# Patient Record
Sex: Male | Born: 1975 | Race: White | Hispanic: No | Smoking: Never smoker
Health system: Southern US, Community
[De-identification: ages and names within clinical notes are randomized; demographics above are authoritative.]

## PROBLEM LIST (undated history)

## (undated) DIAGNOSIS — N200 Calculus of kidney: Secondary | ICD-10-CM

## (undated) DIAGNOSIS — K635 Polyp of colon: Secondary | ICD-10-CM

## (undated) DIAGNOSIS — S83249A Other tear of medial meniscus, current injury, unspecified knee, initial encounter: Secondary | ICD-10-CM

## (undated) DIAGNOSIS — I5189 Other ill-defined heart diseases: Secondary | ICD-10-CM

## (undated) DIAGNOSIS — I2510 Atherosclerotic heart disease of native coronary artery without angina pectoris: Secondary | ICD-10-CM

## (undated) DIAGNOSIS — I10 Essential (primary) hypertension: Secondary | ICD-10-CM

## (undated) DIAGNOSIS — E785 Hyperlipidemia, unspecified: Secondary | ICD-10-CM

## (undated) DIAGNOSIS — R002 Palpitations: Secondary | ICD-10-CM

## (undated) DIAGNOSIS — M51369 Other intervertebral disc degeneration, lumbar region without mention of lumbar back pain or lower extremity pain: Secondary | ICD-10-CM

## (undated) DIAGNOSIS — M5136 Other intervertebral disc degeneration, lumbar region: Secondary | ICD-10-CM

## (undated) DIAGNOSIS — M5126 Other intervertebral disc displacement, lumbar region: Secondary | ICD-10-CM

## (undated) HISTORY — PX: COLONOSCOPY: SHX174

## (undated) HISTORY — PX: MENISCUS REPAIR: SHX5179

## (undated) HISTORY — DX: Atherosclerotic heart disease of native coronary artery without angina pectoris: I25.10

## (undated) HISTORY — DX: Morbid (severe) obesity due to excess calories: E66.01

## (undated) HISTORY — PX: TONSILLECTOMY: SUR1361

## (undated) HISTORY — PX: CARPAL TUNNEL RELEASE: SHX101

## (undated) HISTORY — DX: Other intervertebral disc displacement, lumbar region: M51.26

## (undated) HISTORY — DX: Other tear of medial meniscus, current injury, unspecified knee, initial encounter: S83.249A

## (undated) HISTORY — DX: Hyperlipidemia, unspecified: E78.5

## (undated) HISTORY — PX: LITHOTRIPSY: SUR834

## (undated) HISTORY — DX: Essential (primary) hypertension: I10

## (undated) HISTORY — DX: Other intervertebral disc degeneration, lumbar region: M51.36

## (undated) HISTORY — DX: Other ill-defined heart diseases: I51.89

## (undated) HISTORY — DX: Calculus of kidney: N20.0

## (undated) HISTORY — DX: Other intervertebral disc degeneration, lumbar region without mention of lumbar back pain or lower extremity pain: M51.369

## (undated) HISTORY — DX: Palpitations: R00.2

---
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
## 2006-04-01 ENCOUNTER — Ambulatory Visit: Payer: Self-pay | Admitting: General Practice

## 2008-05-28 ENCOUNTER — Emergency Department: Payer: Self-pay | Admitting: Emergency Medicine

## 2008-06-29 ENCOUNTER — Encounter: Payer: Self-pay | Admitting: Cardiovascular Disease

## 2008-12-27 ENCOUNTER — Encounter: Payer: Self-pay | Admitting: Cardiovascular Disease

## 2009-03-17 ENCOUNTER — Emergency Department: Payer: Self-pay | Admitting: Emergency Medicine

## 2009-03-28 ENCOUNTER — Emergency Department: Payer: Self-pay | Admitting: Emergency Medicine

## 2009-03-30 ENCOUNTER — Ambulatory Visit: Payer: Self-pay | Admitting: Urology

## 2009-03-31 ENCOUNTER — Ambulatory Visit: Payer: Self-pay | Admitting: Urology

## 2009-04-11 ENCOUNTER — Ambulatory Visit: Payer: Self-pay | Admitting: Urology

## 2009-06-29 ENCOUNTER — Encounter: Payer: Self-pay | Admitting: Cardiovascular Disease

## 2009-11-01 ENCOUNTER — Ambulatory Visit: Payer: Self-pay | Admitting: Urology

## 2010-03-02 ENCOUNTER — Emergency Department: Payer: Self-pay | Admitting: Emergency Medicine

## 2010-08-14 ENCOUNTER — Telehealth (INDEPENDENT_AMBULATORY_CARE_PROVIDER_SITE_OTHER): Payer: Self-pay | Admitting: *Deleted

## 2010-08-16 ENCOUNTER — Ambulatory Visit: Payer: Self-pay | Admitting: Cardiovascular Disease

## 2010-08-16 ENCOUNTER — Encounter: Payer: Self-pay | Admitting: Cardiovascular Disease

## 2010-08-16 ENCOUNTER — Ambulatory Visit (INDEPENDENT_AMBULATORY_CARE_PROVIDER_SITE_OTHER): Payer: BC Managed Care – PPO | Admitting: Cardiovascular Disease

## 2010-08-16 DIAGNOSIS — I10 Essential (primary) hypertension: Secondary | ICD-10-CM

## 2010-08-16 DIAGNOSIS — E785 Hyperlipidemia, unspecified: Secondary | ICD-10-CM

## 2010-08-16 DIAGNOSIS — E669 Obesity, unspecified: Secondary | ICD-10-CM

## 2010-08-16 DIAGNOSIS — R000 Tachycardia, unspecified: Secondary | ICD-10-CM

## 2010-08-16 DIAGNOSIS — R0602 Shortness of breath: Secondary | ICD-10-CM | POA: Insufficient documentation

## 2010-08-22 ENCOUNTER — Encounter: Payer: Self-pay | Admitting: Cardiovascular Disease

## 2010-08-24 NOTE — Letter (Signed)
Summary: Medical Record Release  Medical Record Release   Imported By: Harlon Flor 08/18/2010 16:55:07  _____________________________________________________________________  External Attachment:    Type:   Image     Comment:   External Document

## 2010-08-24 NOTE — Progress Notes (Signed)
Summary: Called pt  Phone Note Outgoing Call Call back at [facility] Phone [phone]   Call placed by: Harlon Flor,  August 14, 2010 4:07 PM Call placed to: Patient Summary of Call: LMOM TCB to schedule appt for 08/16/10 to 9:30. Initial call taken by: Harlon Flor,  August 14, 2010 4:07 PM

## 2010-08-24 NOTE — Assessment & Plan Note (Signed)
Summary: [facility]   Visit Type:  Initial Consult Primary Provider:  Dr. Dorothey Baseman  CC:  Establish care. Strong family hx and mother just passed away from MI. c/o rapid HR. denies chest pain and SOB.Marland Kitchen  History of Present Illness: [name] is a very pleasant 35 year old gentleman with obesity, hypertension, hyperlipidemia remote history of tachycardia and palpitations with a strong family history of coronary artery disease who presents to reestablish care.  He reports that his mother died one month ago from heart attack and complications with sepsis.he was told that his mother had a congenital abnormality and that he should have his heart evaluated.  He does have tachycardia palpitations in the evening even on his metoprolol. He is tolerating simvastatin well. No chest pain, he does have some mild shortness of breath with exertion. no significant lower extremity edema. He has been battling with his weight. He does have carpal tunnel as scheduled to have surgery in the near future. He has not had his cholesterol checked this year.  EKG shows normal sinus rhythm with rate 75 beats per minute, no significant ST or T wave changes  Preventive Screening-Counseling & Management  Alcohol-Tobacco     Smoking Status: quit  Caffeine-Diet-Exercise     Does Patient Exercise: no      Drug Use:  no.    Current Medications (verified): 1)  Simvastatin 40 Mg Tabs (Simvastatin) .Marland Kitchen.. 1 Tablet Once Daily 2)  Fish Oil 1000 Mg Caps (Omega-3 Fatty Acids) .Marland Kitchen.. 1 Tablet Once Daily 3)  Metoprolol Tartrate 25 Mg Tabs (Metoprolol Tartrate) .Marland Kitchen.. 1 Tablet Two Times A Day  Allergies (verified): No Known Drug Allergies  Past History:  Past Medical History: Last updated: Sep 14, 2010 CAD Tachy palpitations Hyperlipidemia Hypertension  Family History: Last updated: 2010/09/14 Father: CHF Mother: deceased-MI,CHF Siblings: brother-Heart problems  Social History: Last updated: 09-14-2010 Full  Time Married  Tobacco Use - Former.  Alcohol Use - yes-occasional Regular Exercise - no Drug Use - no  Risk Factors: Exercise: no (09/14/2010)  Risk Factors: Smoking Status: quit (2010-09-14)  Family History: Father: CHF Mother: deceased-MI,CHF Siblings: brother-Heart problems  Social History: Full Time Married  Tobacco Use - Former.  Alcohol Use - yes-occasional Regular Exercise - no Drug Use - no Smoking Status:  quit Does Patient Exercise:  no Drug Use:  no  Review of Systems       The patient complains of weight gain and dyspnea on exertion.  The patient denies fever, weight loss, vision loss, decreased hearing, hoarseness, chest pain, syncope, peripheral edema, prolonged cough, abdominal pain, incontinence, muscle weakness, depression, and enlarged lymph nodes.         tachycardia  Vital Signs:  Patient profile:   35 year old male Height:      70 inches Weight:      331.50 pounds BMI:     47.74 Pulse rate:   75 / minute BP sitting:   129 / 88  (left arm) Cuff size:   large  Vitals Entered By: Lysbeth Galas CMA 2010/09/14 10:16 AM)  Physical Exam  General:  Well developed, well nourished, in no acute distress.obese Head:  normocephalic and atraumatic Neck:  Neck supple, no JVD. No masses, thyromegaly or abnormal cervical nodes. Lungs:  Clear bilaterally to auscultation and percussion. Heart:  Non-displaced PMI, chest non-tender; regular rate and rhythm, S1, S2 without murmurs, rubs or gallops. Carotid upstroke normal, no bruit. Normal abdominal aortic size, no bruits.Pedals normal pulses. No edema, no varicosities.  Abdomen:  Bowel sounds positive; abdomen soft and non-tender without masses, obese Msk:  Back normal, normal gait. Muscle strength and tone normal. Pulses:  pulses normal in all 4 extremities Extremities:  No clubbing or cyanosis. Neurologic:  Alert and oriented x 3. Skin:  Intact without lesions or rashes. Psych:  Normal  affect.   Impression & Recommendations:  Problem # 1:  TACHYCARDIA (ICD-785.0) history of tachycardia palpitations, improved on beta blocker. He continues to have symptoms. Given his mother was diagnosed with recent congenital abnormality ( details are unavailable), we have ordered an echocardiogram. I'm concerned about cardiomyopathy.  Orders: Echocardiogram (Echo)  Problem # 2:  HYPERTENSION, BENIGN (ICD-401.1) Blood pressure was initially elevated on his first check, improved on repeat blood pressure check. I asked him to monitor his blood pressure at home.  His updated medication list for this problem includes:    Metoprolol Tartrate 25 Mg Tabs (Metoprolol tartrate) .Marland Kitchen... 1 tablet two times a day  Problem # 3:  HYPERLIPIDEMIA-MIXED (ICD-272.4) Continue simvastatin 40 mg daily. He will check his cholesterol with Dr. Dorothey Baseman. Ideally, goal LDL should be less than 100.  His updated medication list for this problem includes:    Simvastatin 40 Mg Tabs (Simvastatin) .Marland Kitchen... 1 tablet once daily  Problem # 4:  OVERWEIGHT/OBESITY (ICD-278.02) We have given him a dietary died, encouraged him to increase his exercise and diet  Other Orders: EKG w/ Interpretation (93000)  Patient Instructions: 1)  Your physician recommends that you schedule a follow-up appointment in: 1 year 2)  Your physician recommends that you continue on your current medications as directed. Please refer to the Current Medication list given to you today. 3)  Your physician has requested that you have an echocardiogram.  Echocardiography is a painless test that uses sound waves to create images of your heart. It provides your doctor with information about the size and shape of your heart and how well your heart's chambers and valves are working.  This procedure takes approximately one hour. There are no restrictions for this procedure. Prescriptions: SIMVASTATIN 40 MG TABS (SIMVASTATIN) 1 tablet once daily  #30 x 6    Entered by:   Lanny Hurst RN   Authorized by:   Dossie Arbour MD   Signed by:   Lanny Hurst RN on 08/16/2010   Method used:   Electronically to        [id number] (retail)       [address]       [place], [place]  [address]       Ph: [phone] or [phone]       Fax: [phone]   RxID:   [id number]

## 2010-08-25 ENCOUNTER — Ambulatory Visit: Payer: Self-pay | Admitting: Specialist

## 2010-09-05 NOTE — Letter (Signed)
Summary: Medical Record Release  Medical Record Release   Imported By: Harlon Flor 08/28/2010 16:05:10  _____________________________________________________________________  External Attachment:    Type:   Image     Comment:   External Document

## 2010-09-12 ENCOUNTER — Other Ambulatory Visit (INDEPENDENT_AMBULATORY_CARE_PROVIDER_SITE_OTHER): Payer: BC Managed Care – PPO

## 2010-09-12 ENCOUNTER — Other Ambulatory Visit: Payer: Self-pay | Admitting: Cardiovascular Disease

## 2010-09-12 DIAGNOSIS — R002 Palpitations: Secondary | ICD-10-CM

## 2010-09-19 NOTE — Letter (Signed)
Summary: [facility] Office Note   [facility] Office Note   Imported By: Roderic Ovens 09/15/2010 11:07:44  _____________________________________________________________________  External Attachment:    Type:   Image     Comment:   External Document

## 2011-01-31 ENCOUNTER — Encounter: Payer: Self-pay | Admitting: Cardiovascular Disease

## 2011-04-24 ENCOUNTER — Telehealth: Payer: Self-pay

## 2011-04-24 MED ORDER — SIMVASTATIN 40 MG PO TABS: 40.0000 mg | ORAL_TABLET | Freq: Every day | ORAL | Status: DC

## 2011-04-24 NOTE — Telephone Encounter (Signed)
Refill sent for simvastatin 40 mg take one tablet daily. 

## 2011-05-29 ENCOUNTER — Ambulatory Visit (INDEPENDENT_AMBULATORY_CARE_PROVIDER_SITE_OTHER): Payer: BC Managed Care – PPO | Admitting: Cardiovascular Disease

## 2011-05-29 ENCOUNTER — Encounter: Payer: Self-pay | Admitting: Cardiovascular Disease

## 2011-05-29 VITALS — BP 120/92 | HR 81 | Ht 71.0 in | Wt 333.0 lb

## 2011-05-29 DIAGNOSIS — R0602 Shortness of breath: Secondary | ICD-10-CM

## 2011-05-29 DIAGNOSIS — R000 Tachycardia, unspecified: Secondary | ICD-10-CM

## 2011-05-29 DIAGNOSIS — I10 Essential (primary) hypertension: Secondary | ICD-10-CM

## 2011-05-29 DIAGNOSIS — E663 Overweight: Secondary | ICD-10-CM

## 2011-05-29 DIAGNOSIS — E785 Hyperlipidemia, unspecified: Secondary | ICD-10-CM

## 2011-05-29 NOTE — Assessment & Plan Note (Signed)
We have suggested he have his cholesterol checked at work and fax this to [place]. If cholesterol continues to be greater than 200, we would likely suggest a change to a high dose Lipitor 80 mg daily

## 2011-05-29 NOTE — Assessment & Plan Note (Signed)
Rare episode of shortness of breath that wake him at night time, concerning for sleep apnea. Poor diet, obesity also likely contributing. Unable to exclude mild fluid retention as his diet has been poor. We have offered a low-dose diuretic pill that he could take as needed.

## 2011-05-29 NOTE — Patient Instructions (Addendum)
You are doing well.  Please try omeprazole/prevacid daily for heartburn You can go up to two a day  Call the office if you would like a sleep study  Please call us if you have new issues that need to be addressed before your next appt.  The office will contact you for a follow up Appt. In 6 months

## 2011-05-29 NOTE — Progress Notes (Signed)
Patient ID: Kirk Smith, male    DOB: 30-Jul-1975, 35 y.o.   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 His weight continues to be a problem and he has been working long hours with poor diet and minimal exercise. He reports having 2 episodes that woke him from sleep, both with symptoms of tachycardia, the second with shortness of breath as well. He has been doing better with metoprolol. He does not have any episodes of tachycardia in the daytime. There is significant stress in his life. He is uncertain if the events overnight with tachycardia were from panic attack. The second episode he felt that he might have had water in his lungs. The first episode felt like he had been sprinting and lasted for quite some time before it resolved. He reported heart rates in the 90-100 range.   EKG shows normal sinus rhythm with rate 84 beats per minute, no significant ST or T wave changes     Outpatient Encounter Prescriptions as of 05/29/2011  Medication Sig Dispense Refill  . fenofibrate (TRICOR) 145 MG tablet Take 145 mg by mouth daily.        . metoprolol tartrate (LOPRESSOR) 25 MG tablet Take 25 mg by mouth 2 (two) times daily.        . Omega-3 Fatty Acids (FISH OIL) 1000 MG CAPS Take 1 capsule by mouth daily.        . simvastatin (ZOCOR) 40 MG tablet Take 1 tablet (40 mg total) by mouth daily.  30 tablet  6    Review of Systems  Constitutional: Negative.   HENT: Negative.   Eyes: Negative.   Respiratory: Positive for shortness of breath.   Cardiovascular: Positive for palpitations.  Gastrointestinal: Negative.   Musculoskeletal: Negative.   Skin: Negative.   Neurological: Negative.   Hematological:  Negative.   Psychiatric/Behavioral: Negative.   All other systems reviewed and are negative.    BP 120/92  Pulse 81  Ht 5\' 11"  (1.803 m)  Wt 333 lb (151.048 kg)  BMI 46.44 kg/m2  Physical Exam  Nursing note and vitals reviewed. Constitutional: He is oriented to person, place, and time. He appears well-developed and well-nourished.       obese  HENT:  Head: Normocephalic.  Nose: Nose normal.  Mouth/Throat: Oropharynx is clear and moist.  Eyes: Conjunctivae are normal. Pupils are equal, round, and reactive to light.  Neck: Normal range of motion. Neck supple. No JVD present.  Cardiovascular: Normal rate, regular rhythm, S1 normal, S2 normal, normal heart sounds and intact distal pulses.  Exam reveals no gallop and no friction rub.   No murmur heard. Pulmonary/Chest: Effort normal and breath sounds normal. No respiratory distress. He has no wheezes. He has no rales. He exhibits no tenderness.  Abdominal: Soft. Bowel sounds are normal. He exhibits no distension. There is no tenderness.  Musculoskeletal: Normal range of motion. He exhibits no edema and no tenderness.  Lymphadenopathy:    He has no cervical adenopathy.  Neurological: He is alert and oriented to person, place, and time. Coordination normal.  Skin: Skin is warm and dry. No rash noted. No erythema.  Psychiatric: He has a normal mood and affect. His behavior is normal. Judgment and thought content normal.  Assessment and Plan

## 2011-05-29 NOTE — Assessment & Plan Note (Signed)
Recent episodes of tachycardia that wake him at night. We discussed the various options and differential. Uncertain if this is secondary to stress. Unable to exclude obstructive sleep apnea. He will discuss his snoring with his wife. He does not think that he snores very much. If you would like a stress test, I have asked him to call our office and we will help arrange this.

## 2011-05-29 NOTE — Assessment & Plan Note (Signed)
Blood pressure is well controlled on today's visit. No changes made to the medications. 

## 2011-05-29 NOTE — Assessment & Plan Note (Signed)
His weight is a major issue. In the future, we might suggest a lap band or gastric bypass surgery.

## 2011-06-19 ENCOUNTER — Telehealth: Payer: Self-pay | Admitting: *Deleted

## 2011-06-19 DIAGNOSIS — E785 Hyperlipidemia, unspecified: Secondary | ICD-10-CM

## 2011-06-19 MED ORDER — ATORVASTATIN CALCIUM 80 MG PO TABS: 40.0000 mg | ORAL_TABLET | Freq: Every day | ORAL | Status: DC

## 2011-06-19 NOTE — Telephone Encounter (Signed)
Opened in error

## 2011-06-19 NOTE — Telephone Encounter (Signed)
Copied from lab note: Pt had labs at work Systems analyst) you saw on 11/20 said if cholesterol continues to be greater than 200, we would likely suggest a change to a high dose Lipitor 80 mg daily; he currently takes zocor 40mg  daily. Total is 167.   Pt called back this AM stating his PCP wanted him to change to crestor. Discussed with Dr. Mariah Milling, and advised he start Lipitor 80mg  first. Pt asks if he can start with 40mg  first then go to 80 if needed. Rx sent for 80mg  pt will cut in half and will need labs in 3 mo.

## 2011-07-18 ENCOUNTER — Telehealth: Payer: Self-pay

## 2011-07-18 MED ORDER — METOPROLOL TARTRATE 25 MG PO TABS: 25.0000 mg | ORAL_TABLET | Freq: Two times a day (BID) | ORAL | Status: DC

## 2011-07-18 NOTE — Telephone Encounter (Signed)
Refill sent for metoprolol.  

## 2011-10-18 ENCOUNTER — Telehealth: Payer: Self-pay

## 2011-10-18 MED ORDER — METOPROLOL TARTRATE 25 MG PO TABS: 25.0000 mg | ORAL_TABLET | Freq: Two times a day (BID) | ORAL | Status: DC

## 2011-10-18 NOTE — Telephone Encounter (Signed)
Refill sent for 90 day supply for metoprolol.

## 2012-08-01 ENCOUNTER — Ambulatory Visit (INDEPENDENT_AMBULATORY_CARE_PROVIDER_SITE_OTHER): Payer: BC Managed Care – PPO | Admitting: Cardiovascular Disease

## 2012-08-01 ENCOUNTER — Encounter: Payer: Self-pay | Admitting: Cardiovascular Disease

## 2012-08-01 VITALS — BP 148/80 | HR 81 | Ht 70.0 in | Wt 327.8 lb

## 2012-08-01 DIAGNOSIS — E785 Hyperlipidemia, unspecified: Secondary | ICD-10-CM

## 2012-08-01 DIAGNOSIS — E663 Overweight: Secondary | ICD-10-CM

## 2012-08-01 DIAGNOSIS — I10 Essential (primary) hypertension: Secondary | ICD-10-CM

## 2012-08-01 DIAGNOSIS — R000 Tachycardia, unspecified: Secondary | ICD-10-CM

## 2012-08-01 NOTE — Patient Instructions (Addendum)
You are doing well. Please try extra 1/2 doses of metoprolol for high blood pressure  Blood work through city  Please call us if you have new issues that need to be addressed before your next appt.  Your physician wants you to follow-up in: 12 months.  You will receive a reminder letter in the mail two months in advance. If you don't receive a letter, please call our office to schedule the follow-up appointment.

## 2012-08-01 NOTE — Assessment & Plan Note (Signed)
We have encouraged continued exercise, careful diet management in an effort to lose weight. 

## 2012-08-01 NOTE — Assessment & Plan Note (Signed)
Blood pressure running borderline high. We have suggested he increase metoprolol tartrate to 37.5 mg twice a day. If blood pressure continues to run high, additional medications could be added such as HCTZ or amlodipine.

## 2012-08-01 NOTE — Progress Notes (Signed)
   Patient ID: Kirk Smith, male    DOB: [birth year], 37 y.o.   MRN: [id number]  HPI Comments: Mr. 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 His weight continues to be a problem and he has been working long hours with poor diet and minimal exercise. No recent symptoms of tachycardia and he is taking metoprolol tartrate 25 mg twice a day Significant recent stress, loss of his brother and his wife in motor vehicle accident. they were on a motorcycle.  Tolerating statin with no problems. No recent blood work.   EKG shows normal sinus rhythm with rate 81 beats per minute, no significant ST or T wave changes     Outpatient Encounter Prescriptions as of 08/01/2012  Medication Sig Dispense Refill  . atorvastatin (LIPITOR) 40 MG tablet Take 40 mg by mouth daily.      . fenofibrate (TRICOR) 145 MG tablet Take 145 mg by mouth daily.        . metoprolol tartrate (LOPRESSOR) 25 MG tablet Take 1 tablet (25 mg total) by mouth 2 (two) times daily.  180 tablet  3  . [DISCONTINUED] Omega-3 Fatty Acids (FISH OIL) 1000 MG CAPS Take 1 capsule by mouth daily.          Review of Systems  Constitutional: Negative.   HENT: Negative.   Eyes: Negative.   Gastrointestinal: Negative.   Musculoskeletal: Negative.   Skin: Negative.   Neurological: Negative.   Hematological: Negative.   Psychiatric/Behavioral: Negative.   All other systems reviewed and are negative.    BP 148/80  Pulse 81  Ht 5\' 10"  (1.778 m)  Wt 327 lb 12 oz (148.666 kg)  BMI 47.03 kg/m2  Physical Exam  Nursing note and vitals reviewed. Constitutional: He is oriented to person, place, and time. He appears well-developed and well-nourished.       obese  HENT:  Head:  Normocephalic.  Nose: Nose normal.  Mouth/Throat: Oropharynx is clear and moist.  Eyes: Conjunctivae normal are normal. Pupils are equal, round, and reactive to light.  Neck: Normal range of motion. Neck supple. No JVD present.  Cardiovascular: Normal rate, regular rhythm, S1 normal, S2 normal, normal heart sounds and intact distal pulses.  Exam reveals no gallop and no friction rub.   No murmur heard. Pulmonary/Chest: Effort normal and breath sounds normal. No respiratory distress. He has no wheezes. He has no rales. He exhibits no tenderness.  Abdominal: Soft. Bowel sounds are normal. He exhibits no distension. There is no tenderness.  Musculoskeletal: Normal range of motion. He exhibits no edema and no tenderness.  Lymphadenopathy:    He has no cervical adenopathy.  Neurological: He is alert and oriented to person, place, and time. Coordination normal.  Skin: Skin is warm and dry. No rash noted. No erythema.  Psychiatric: He has a normal mood and affect. His behavior is normal. Judgment and thought content normal.           Assessment and Plan

## 2012-08-01 NOTE — Assessment & Plan Note (Addendum)
We have suggested he check his blood work. This will be done through the city.

## 2012-08-01 NOTE — Assessment & Plan Note (Signed)
No recent episodes of tachycardia. Continue metoprolol.

## 2012-08-05 ENCOUNTER — Other Ambulatory Visit: Payer: Self-pay | Admitting: Cardiovascular Disease

## 2012-09-09 ENCOUNTER — Other Ambulatory Visit: Payer: Self-pay | Admitting: Cardiovascular Disease

## 2012-09-09 NOTE — Telephone Encounter (Signed)
Refilled Metoprolol sent to [facility]. 

## 2013-03-11 ENCOUNTER — Ambulatory Visit: Payer: Self-pay | Admitting: General Practice

## 2013-05-25 ENCOUNTER — Ambulatory Visit: Payer: Self-pay | Admitting: General Practice

## 2013-07-28 ENCOUNTER — Ambulatory Visit: Payer: Self-pay | Admitting: Specialist

## 2013-08-03 ENCOUNTER — Encounter (INDEPENDENT_AMBULATORY_CARE_PROVIDER_SITE_OTHER): Payer: Self-pay

## 2013-08-03 ENCOUNTER — Encounter: Payer: Self-pay | Admitting: Cardiovascular Disease

## 2013-08-03 ENCOUNTER — Ambulatory Visit (INDEPENDENT_AMBULATORY_CARE_PROVIDER_SITE_OTHER): Payer: BC Managed Care – PPO | Admitting: Cardiovascular Disease

## 2013-08-03 VITALS — BP 141/86 | HR 80 | Ht 71.0 in | Wt 313.2 lb

## 2013-08-03 DIAGNOSIS — E663 Overweight: Secondary | ICD-10-CM

## 2013-08-03 DIAGNOSIS — E785 Hyperlipidemia, unspecified: Secondary | ICD-10-CM

## 2013-08-03 DIAGNOSIS — R000 Tachycardia, unspecified: Secondary | ICD-10-CM

## 2013-08-03 DIAGNOSIS — I10 Essential (primary) hypertension: Secondary | ICD-10-CM

## 2013-08-03 MED ORDER — METOPROLOL TARTRATE 25 MG PO TABS
25.0000 mg | ORAL_TABLET | Freq: Two times a day (BID) | ORAL | Status: DC
Start: 2013-08-03 — End: 2014-09-16

## 2013-08-03 MED ORDER — ATORVASTATIN CALCIUM 40 MG PO TABS: 40.0000 mg | ORAL_TABLET | Freq: Every day | ORAL | Status: DC

## 2013-08-03 NOTE — Assessment & Plan Note (Signed)
We have encouraged continued exercise, careful diet management in an effort to lose weight. 

## 2013-08-03 NOTE — Assessment & Plan Note (Signed)
Re: well-controlled on low-dose metoprolol

## 2013-08-03 NOTE — Progress Notes (Signed)
   Patient ID: Kirk LindauDavid Wayne Smith, male    DOB: 10/09/1975, 38 y.o.   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  On his visit today, he has been exercising on a regular basis, down 15 pounds from his prior clinic visit. Overall feels well with no complaints. No recent symptoms of tachycardia and he is taking metoprolol tartrate 25 mg twice a day Tolerating statin with no problems. Last blood work January 2014 with total cholesterol less than 150 He wonders if he can stop the fenofibrate   EKG shows normal sinus rhythm with rate 80 beats per minute, no significant ST or T wave changes     Outpatient Encounter Prescriptions as of 08/03/2013  Medication Sig  . atorvastatin (LIPITOR) 40 MG tablet Take 40 mg by mouth daily.  . fenofibrate (TRICOR) 145 MG tablet Take 145 mg by mouth daily.    . metoprolol tartrate (LOPRESSOR) 25 MG tablet TAKE 1 TABLET BY MOUTH TWICE A DAY  . [DISCONTINUED] atorvastatin (LIPITOR) 40 MG tablet Take 40 mg by mouth daily.  . [DISCONTINUED] atorvastatin (LIPITOR) 80 MG tablet TAKE 1/2 TABLET BY MOUTH EVERY DAY    Review of Systems  Constitutional: Negative.   HENT: Negative.   Eyes: Negative.   Respiratory: Negative.   Cardiovascular: Negative.   Gastrointestinal: Negative.   Endocrine: Negative.   Musculoskeletal: Negative.   Skin: Negative.   Allergic/Immunologic: Negative.   Neurological: Negative.   Hematological: Negative.   Psychiatric/Behavioral: Negative.   All other systems reviewed and are negative.    BP 141/86  Pulse 80  Ht 5\' 11"  (1.803 m)  Wt 313 lb 4 oz (142.089 kg)  BMI 43.71 kg/m2  Physical Exam  Nursing note and vitals  reviewed. Constitutional: He is oriented to person, place, and time. He appears well-developed and well-nourished.  obese  HENT:  Head: Normocephalic.  Nose: Nose normal.  Mouth/Throat: Oropharynx is clear and moist.  Eyes: Conjunctivae are normal. Pupils are equal, round, and reactive to light.  Neck: Normal range of motion. Neck supple. No JVD present.  Cardiovascular: Normal rate, regular rhythm, S1 normal, S2 normal, normal heart sounds and intact distal pulses.  Exam reveals no gallop and no friction rub.   No murmur heard. Pulmonary/Chest: Effort normal and breath sounds normal. No respiratory distress. He has no wheezes. He has no rales. He exhibits no tenderness.  Abdominal: Soft. Bowel sounds are normal. He exhibits no distension. There is no tenderness.  Musculoskeletal: Normal range of motion. He exhibits no edema and no tenderness.  Lymphadenopathy:    He has no cervical adenopathy.  Neurological: He is alert and oriented to person, place, and time. Coordination normal.  Skin: Skin is warm and dry. No rash noted. No erythema.  Psychiatric: He has a normal mood and affect. His behavior is normal. Judgment and thought content normal.      Assessment and Plan

## 2013-08-03 NOTE — Assessment & Plan Note (Signed)
Cholesterol is at goal on the current lipid regimen. No changes to the medications were made. Okay to hold the fenofibrate

## 2013-08-03 NOTE — Assessment & Plan Note (Signed)
Blood pressure is well controlled on today's visit. No changes made to the medications. He does have occasional fatigue. Suggested he could try Toprol 12.5 mg twice a day

## 2013-08-03 NOTE — Patient Instructions (Signed)
You are doing well. No medication changes were made.  Ok to cut the metoprolol in 1/2 twice a day If triglycerides stay  low, you could stop the fenofibrate  Please call us if you have new issues that need to be addressed before your next appt.  Your physician wants you to follow-up in: 12 months.  You will receive a reminder letter in the mail two months in advance. If you don't receive a letter, please call our office to schedule the follow-up appointment.

## 2013-11-24 ENCOUNTER — Encounter: Payer: Self-pay | Admitting: General Practice

## 2013-12-07 ENCOUNTER — Encounter: Payer: Self-pay | Admitting: General Practice

## 2014-01-06 ENCOUNTER — Encounter: Payer: Self-pay | Admitting: General Practice

## 2014-02-03 ENCOUNTER — Ambulatory Visit: Payer: Self-pay | Admitting: General Practice

## 2014-03-23 DIAGNOSIS — M5136 Other intervertebral disc degeneration, lumbar region: Secondary | ICD-10-CM | POA: Insufficient documentation

## 2014-03-23 DIAGNOSIS — M51369 Other intervertebral disc degeneration, lumbar region without mention of lumbar back pain or lower extremity pain: Secondary | ICD-10-CM | POA: Insufficient documentation

## 2014-03-23 DIAGNOSIS — M62830 Muscle spasm of back: Secondary | ICD-10-CM | POA: Insufficient documentation

## 2014-08-12 ENCOUNTER — Emergency Department: Payer: Self-pay | Admitting: Emergency Medicine

## 2014-08-12 LAB — URINALYSIS, COMPLETE
BACTERIA: NONE SEEN
BILIRUBIN, UR: NEGATIVE
GLUCOSE, UR: NEGATIVE mg/dL (ref 0–75)
KETONE: NEGATIVE
Leukocyte Esterase: NEGATIVE
NITRITE: NEGATIVE
Ph: 5 (ref 4.5–8.0)
Protein: 30
RBC,UR: 52 /HPF (ref 0–5)
SPECIFIC GRAVITY: 1.031 (ref 1.003–1.030)

## 2014-08-12 LAB — COMPREHENSIVE METABOLIC PANEL
ALT: 47 U/L (ref 14–63)
ANION GAP: 6 — AB (ref 7–16)
AST: 26 U/L (ref 15–37)
Albumin: 3.7 g/dL (ref 3.4–5.0)
Alkaline Phosphatase: 89 U/L (ref 46–116)
BUN: 11 mg/dL (ref 7–18)
Bilirubin,Total: 0.4 mg/dL (ref 0.2–1.0)
CHLORIDE: 106 mmol/L (ref 98–107)
CO2: 29 mmol/L (ref 21–32)
CREATININE: 1.19 mg/dL (ref 0.60–1.30)
Calcium, Total: 9 mg/dL (ref 8.5–10.1)
EGFR (African American): >60
Glucose: 101 mg/dL — ABNORMAL HIGH (ref 65–99)
Osmolality: 281 (ref 275–301)
POTASSIUM: 4.4 mmol/L (ref 3.5–5.1)
Sodium: 141 mmol/L (ref 136–145)
Total Protein: 7.5 g/dL (ref 6.4–8.2)

## 2014-08-12 LAB — CBC
HCT: 44.4 % (ref 40.0–52.0)
HGB: 14.7 g/dL (ref 13.0–18.0)
MCH: 29.4 pg (ref 26.0–34.0)
MCHC: 33.2 g/dL (ref 32.0–36.0)
MCV: 89 fL (ref 80–100)
Platelet: 243 10*3/uL (ref 150–440)
RBC: 5.01 10*6/uL (ref 4.40–5.90)
RDW: 12.8 % (ref 11.5–14.5)
WBC: 7.8 10*3/uL (ref 3.8–10.6)

## 2014-08-16 ENCOUNTER — Emergency Department: Payer: Self-pay | Admitting: Emergency Medicine

## 2014-08-19 ENCOUNTER — Ambulatory Visit: Payer: Self-pay | Admitting: Urology

## 2014-08-27 ENCOUNTER — Ambulatory Visit: Payer: Self-pay | Admitting: Urology

## 2014-09-02 ENCOUNTER — Other Ambulatory Visit: Payer: Self-pay | Admitting: Cardiovascular Disease

## 2014-09-04 ENCOUNTER — Other Ambulatory Visit: Payer: Self-pay | Admitting: Cardiovascular Disease

## 2014-09-11 ENCOUNTER — Ambulatory Visit: Payer: Self-pay | Admitting: General Practice

## 2014-09-16 ENCOUNTER — Other Ambulatory Visit: Payer: Self-pay

## 2014-09-16 MED ORDER — METOPROLOL TARTRATE 25 MG PO TABS: 25.0000 mg | ORAL_TABLET | Freq: Two times a day (BID) | ORAL | Status: DC

## 2014-09-23 ENCOUNTER — Encounter (INDEPENDENT_AMBULATORY_CARE_PROVIDER_SITE_OTHER): Payer: Self-pay

## 2014-09-23 ENCOUNTER — Ambulatory Visit (INDEPENDENT_AMBULATORY_CARE_PROVIDER_SITE_OTHER): Payer: BLUE CROSS/BLUE SHIELD | Admitting: Cardiovascular Disease

## 2014-09-23 ENCOUNTER — Encounter: Payer: Self-pay | Admitting: Cardiovascular Disease

## 2014-09-23 VITALS — BP 130/86 | HR 72 | Ht 71.0 in | Wt 312.0 lb

## 2014-09-23 DIAGNOSIS — M75101 Unspecified rotator cuff tear or rupture of right shoulder, not specified as traumatic: Secondary | ICD-10-CM

## 2014-09-23 DIAGNOSIS — R000 Tachycardia, unspecified: Secondary | ICD-10-CM

## 2014-09-23 DIAGNOSIS — I10 Essential (primary) hypertension: Secondary | ICD-10-CM

## 2014-09-23 DIAGNOSIS — E785 Hyperlipidemia, unspecified: Secondary | ICD-10-CM

## 2014-09-23 DIAGNOSIS — M75100 Unspecified rotator cuff tear or rupture of unspecified shoulder, not specified as traumatic: Secondary | ICD-10-CM | POA: Insufficient documentation

## 2014-09-23 DIAGNOSIS — E669 Obesity, unspecified: Secondary | ICD-10-CM

## 2014-09-23 MED ORDER — ATORVASTATIN CALCIUM 40 MG PO TABS: 40.0000 mg | ORAL_TABLET | Freq: Every day | ORAL | Status: DC

## 2014-09-23 MED ORDER — METOPROLOL TARTRATE 25 MG PO TABS: 25.0000 mg | ORAL_TABLET | Freq: Two times a day (BID) | ORAL | Status: DC

## 2014-09-23 NOTE — Progress Notes (Signed)
Patient ID: Kirk Smith, male    DOB: 01/14/1976, 39 y.o.   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 He had a injury to his back and bulging disc, kidney stone requiring cystoscopy and intervention, tear in his right shoulder to the rotator cuff. Unclear if he needs surgery at this time. He has follow-up with with PDA set [facility] week. Otherwise feels well. Very rare palpitations, in general has done well on metoprolol. Tolerating his cholesterol medication. He remembers total cholesterol roughly 140 on his current regimen. Denies any shortness of breath or chest discomfort. Otherwise exercising on a regular basis to stay in shape for his job. Very active at baseline.  EKG on today's visit  shows normal sinus rhythm with rate 72 bpm, no significant ST or T-wave changes   Other past medical history  Previous echocardiogram was essentially normal.   Allergies  Allergen Reactions  . Penicillin G Other (See Comments)  . Penicillins     Outpatient Encounter Prescriptions as of 09/23/2014  Medication Sig  . atorvastatin (LIPITOR) 40 MG tablet Take 1 tablet (40 mg total) by mouth daily.  . meloxicam (MOBIC) 15 MG tablet Take 15 mg by mouth daily.   . methocarbamol (ROBAXIN) 750 MG tablet 1/2-1 po tid prn  . metoprolol tartrate (LOPRESSOR) 25 MG tablet Take 1 tablet (25 mg total) by mouth 2 (two) times daily.  . Omega-3 Fatty Acids (FISH OIL) 1000 MG CAPS Take by mouth.  . [DISCONTINUED] atorvastatin (LIPITOR) 40 MG tablet TAKE 1 TABLET BY MOUTH EVERY DAY  . [DISCONTINUED] metoprolol tartrate (LOPRESSOR) 25 MG tablet Take 1 tablet (25 mg total) by mouth 2 (two) times daily.   . [DISCONTINUED] fenofibrate (TRICOR) 145 MG tablet Take 145 mg by mouth daily.      Past Medical History  Diagnosis Date  . CAD (coronary artery disease)   . Tachycardia     palpitations  . HLD (hyperlipidemia)   . HTN (hypertension)   . Kidney stone   . Bulging lumbar disc     Past Surgical History  Procedure Laterality Date  . Carpal tunnel release    . Carpal tunnel release Left     Social History  reports that he has quit smoking. He does not have any smokeless tobacco history on file. He reports that he does not drink alcohol or use illicit drugs.  Family History family history includes Heart disease in his brother; Heart failure in his father.  Review of Systems  Constitutional: Negative.   Respiratory: Negative.   Cardiovascular: Negative.   Gastrointestinal: Negative.   Musculoskeletal: Negative.   Skin: Negative.   Neurological: Negative.   Hematological: Negative.   Psychiatric/Behavioral: Negative.   All other systems reviewed and are negative.   BP 130/86 mmHg  Pulse 72  Ht 5\' 11"  (1.803 m)  Wt 312 lb (141.522 kg)  BMI 43.53 kg/m2  Physical Exam  Constitutional: He is oriented to person, place, and time. He appears well-developed and well-nourished.  obese  HENT:  Head: Normocephalic.  Nose: Nose normal.  Mouth/Throat: Oropharynx is clear and moist.  Eyes: Conjunctivae are normal. Pupils are equal, round, and reactive to light.  Neck: Normal range of  motion. Neck supple. No JVD present.  Cardiovascular: Normal rate, regular rhythm, S1 normal, S2 normal, normal heart sounds and intact distal pulses.  Exam reveals no gallop and no friction rub.   No murmur heard. Pulmonary/Chest: Effort normal and breath sounds normal. No respiratory distress. He has no wheezes. He has no rales. He exhibits no tenderness.  Abdominal: Soft. Bowel sounds are normal. He exhibits no distension. There is no tenderness.  Musculoskeletal: Normal range of motion. He  exhibits no edema or tenderness.  Lymphadenopathy:    He has no cervical adenopathy.  Neurological: He is alert and oriented to person, place, and time. Coordination normal.  Skin: Skin is warm and dry. No rash noted. No erythema.  Psychiatric: He has a normal mood and affect. His behavior is normal. Judgment and thought content normal.      Assessment and Plan   Nursing note and vitals reviewed.

## 2014-09-23 NOTE — Assessment & Plan Note (Signed)
We have encouraged continued exercise, careful diet management in an effort to lose weight.He

## 2014-09-23 NOTE — Assessment & Plan Note (Signed)
Blood pressure is well controlled on today's visit. No changes made to the medications. 

## 2014-09-23 NOTE — Assessment & Plan Note (Signed)
[facility]. He would be acceptable risk if surgery is needed. No further testing needed. Recommend keeping him on metoprolol through the perioperative period

## 2014-09-23 NOTE — Patient Instructions (Signed)
You are doing well. No medication changes were made.  Please call us if you have new issues that need to be addressed before your next appt.  Your physician wants you to follow-up in: 12 months.  You will receive a reminder letter in the mail two months in advance. If you don't receive a letter, please call our office to schedule the follow-up appointment. 

## 2014-09-23 NOTE — Assessment & Plan Note (Signed)
well-controlled on low-dose metoprolol

## 2014-09-23 NOTE — Assessment & Plan Note (Signed)
Cholesterol is at goal on the current lipid regimen. No changes to the medications were made.  

## 2014-10-30 NOTE — Op Note (Signed)
PATIENT NAME:  Kirk Smith, Kirk Smith MR#:  [id number] DATE OF BIRTH:  1975-10-05  DATE OF PROCEDURE:  07/28/2013  PREOPERATIVE DIAGNOSIS:  Left carpal tunnel syndrome.  POSTOPERATIVE DIAGNOSIS: Left carpal tunnel syndrome.   PROCEDURE PERFORMED: Left carpal tunnel release.   SURGEON: Myra Rudehristopher Jalal Rauch, M.D.   ANESTHESIA: General.   COMPLICATIONS: None.   TOURNIQUET TIME: Approximately 15 minutes.   PROCEDURE IN DETAIL:  After adequate induction of general anesthesia, the left upper extremity is thoroughly prepped with alcohol and ChloraPrep and draped in standard sterile fashion. The extremity is wrapped out with the Esmarch bandage and pneumatic tourniquet elevated to 280 mmHg. Under loupe magnification, standard volar carpal tunnel incision is made, and the dissection carried down to the transverse retinacular ligament. This is incised in the midportion using the knife. The proximal release is performed with the small scissors and the carpal tunnel scissors, and the distal release is performed using the small scissors. There is seen to be moderate compression of the nerve directly beneath the ligament. Careful check is made both proximally and distally to ensure that complete release had been obtained. The wound is thoroughly irrigated multiple times. Skin edges are infiltrated with 0.5% plain Marcaine. The skin is closed with 4-0 nylon suture. Soft bulky dressing is applied. The tourniquet is released. The patient is returned to the recovery room in satisfactory condition having tolerated the procedure quite well.     ____________________________ Clare Gandyhristopher E. Zyeir Dymek, MD ces:dmm D: 07/28/2013 10:23:04 ET T: 07/28/2013 12:34:27 ET JOB#: [id number]  cc: Clare Gandyhristopher E. Darlisha Kelm, MD, <Dictator> Clare GandyHRISTOPHER E Amariyon Maynes MD ELECTRONICALLY SIGNED 07/30/2013 20:23

## 2014-12-06 ENCOUNTER — Emergency Department: Payer: BLUE CROSS/BLUE SHIELD

## 2014-12-06 ENCOUNTER — Emergency Department
Admission: EM | Admit: 2014-12-06 | Discharge: 2014-12-06 | Disposition: A | Discharge status: Home / Self Care | Payer: BLUE CROSS/BLUE SHIELD | Attending: Emergency Medicine | Admitting: Emergency Medicine

## 2014-12-06 ENCOUNTER — Encounter: Payer: Self-pay | Admitting: Emergency Medicine

## 2014-12-06 DIAGNOSIS — Z880 Allergy status to penicillin: Secondary | ICD-10-CM | POA: Insufficient documentation

## 2014-12-06 DIAGNOSIS — R109 Unspecified abdominal pain: Secondary | ICD-10-CM

## 2014-12-06 DIAGNOSIS — I10 Essential (primary) hypertension: Secondary | ICD-10-CM | POA: Insufficient documentation

## 2014-12-06 DIAGNOSIS — Z79899 Other long term (current) drug therapy: Secondary | ICD-10-CM | POA: Insufficient documentation

## 2014-12-06 DIAGNOSIS — R319 Hematuria, unspecified: Secondary | ICD-10-CM

## 2014-12-06 DIAGNOSIS — Z87891 Personal history of nicotine dependence: Secondary | ICD-10-CM | POA: Diagnosis not present

## 2014-12-06 DIAGNOSIS — Z791 Long term (current) use of non-steroidal anti-inflammatories (NSAID): Secondary | ICD-10-CM | POA: Diagnosis not present

## 2014-12-06 DIAGNOSIS — N201 Calculus of ureter: Secondary | ICD-10-CM | POA: Diagnosis not present

## 2014-12-06 LAB — BASIC METABOLIC PANEL
ANION GAP: 8 (ref 5–15)
BUN: 14 mg/dL (ref 6–20)
CO2: 27 mmol/L (ref 22–32)
CREATININE: 0.97 mg/dL (ref 0.61–1.24)
Calcium: 9 mg/dL (ref 8.9–10.3)
Chloride: 106 mmol/L (ref 101–111)
GFR calc non Af Amer: >60 mL/min (ref >60)
Glucose, Bld: 114 mg/dL — ABNORMAL HIGH (ref 65–99)
POTASSIUM: 4.6 mmol/L (ref 3.5–5.1)
Sodium: 141 mmol/L (ref 135–145)

## 2014-12-06 LAB — URINALYSIS COMPLETE WITH MICROSCOPIC (ARMC ONLY)
BACTERIA UA: NONE SEEN
BILIRUBIN URINE: NEGATIVE
Glucose, UA: NEGATIVE mg/dL
Ketones, ur: NEGATIVE mg/dL
Leukocytes, UA: NEGATIVE
Nitrite: NEGATIVE
Protein, ur: >500 mg/dL — AB
Specific Gravity, Urine: 1.028 (ref 1.005–1.030)
Squamous Epithelial / LPF: NONE SEEN
pH: 5 (ref 5.0–8.0)

## 2014-12-06 LAB — CBC
HCT: 45.1 % (ref 40.0–52.0)
HEMOGLOBIN: 15.2 g/dL (ref 13.0–18.0)
MCH: 30.1 pg (ref 26.0–34.0)
MCHC: 33.7 g/dL (ref 32.0–36.0)
MCV: 89.4 fL (ref 80.0–100.0)
Platelets: 233 10*3/uL (ref 150–440)
RBC: 5.04 MIL/uL (ref 4.40–5.90)
RDW: 12.8 % (ref 11.5–14.5)
WBC: 10.5 10*3/uL (ref 3.8–10.6)

## 2014-12-06 MED ORDER — HYDROMORPHONE HCL 1 MG/ML IJ SOLN
1.0000 mg | INTRAMUSCULAR | Status: DC | PRN
  Administered 2014-12-06: 1 mg via INTRAVENOUS

## 2014-12-06 MED ORDER — ONDANSETRON HCL 4 MG/2ML IJ SOLN
4.0000 mg | Freq: Once | INTRAMUSCULAR | Status: AC
  Administered 2014-12-06: 4 mg via INTRAVENOUS

## 2014-12-06 MED ORDER — ONDANSETRON HCL 4 MG/2ML IJ SOLN
INTRAMUSCULAR | Status: AC
  Administered 2014-12-06: 4 mg via INTRAVENOUS
  Filled 2014-12-06: qty 2

## 2014-12-06 MED ORDER — HYDROMORPHONE HCL 1 MG/ML IJ SOLN
INTRAMUSCULAR | Status: AC
  Filled 2014-12-06: qty 1

## 2014-12-06 MED ORDER — HYDROMORPHONE HCL 1 MG/ML IJ SOLN
1.0000 mg | Freq: Once | INTRAMUSCULAR | Status: AC
  Administered 2014-12-06: 1 mg via INTRAVENOUS

## 2014-12-06 MED ORDER — MORPHINE SULFATE 4 MG/ML IJ SOLN
INTRAMUSCULAR | Status: AC
  Filled 2014-12-06: qty 1

## 2014-12-06 MED ORDER — ONDANSETRON HCL 4 MG PO TABS: 4.0000 mg | ORAL_TABLET | Freq: Every day | ORAL | Status: DC | PRN

## 2014-12-06 MED ORDER — TAMSULOSIN HCL 0.4 MG PO CAPS: 0.4000 mg | ORAL_CAPSULE | Freq: Every day | ORAL | Status: DC

## 2014-12-06 MED ORDER — PROMETHAZINE HCL 12.5 MG PO TABS: 12.5000 mg | ORAL_TABLET | Freq: Four times a day (QID) | ORAL | Status: DC | PRN

## 2014-12-06 MED ORDER — MORPHINE SULFATE 4 MG/ML IJ SOLN
INTRAMUSCULAR | Status: AC
  Administered 2014-12-06: 4 mg via INTRAVENOUS
  Filled 2014-12-06: qty 1

## 2014-12-06 MED ORDER — MORPHINE SULFATE 4 MG/ML IJ SOLN
4.0000 mg | Freq: Once | INTRAMUSCULAR | Status: AC
  Administered 2014-12-06: 4 mg via INTRAVENOUS

## 2014-12-06 MED ORDER — OXYCODONE-ACETAMINOPHEN 5-325 MG PO TABS: 1.0000 | ORAL_TABLET | ORAL | Status: DC | PRN

## 2014-12-06 MED ORDER — ONDANSETRON HCL 4 MG/2ML IJ SOLN
INTRAMUSCULAR | Status: AC
  Filled 2014-12-06: qty 2

## 2014-12-06 NOTE — ED Notes (Signed)
Patient requesting more pain medication. Dr. Derrill KayGoodman notified.

## 2014-12-06 NOTE — Discharge Instructions (Signed)
Please seek medical attention for any high fevers, chest pain, shortness of breath, change in behavior, persistent vomiting, bloody stool or any other new or concerning symptoms. ° ° °Kidney Stones °Kidney stones (urolithiasis) are deposits that form inside your kidneys. The intense pain is caused by the stone moving through the urinary tract. When the stone moves, the ureter goes into spasm around the stone. The stone is usually passed in the urine.  °CAUSES  °· A disorder that makes certain neck glands produce too much parathyroid hormone (primary hyperparathyroidism). °· A buildup of uric acid crystals, similar to gout in your joints. °· Narrowing (stricture) of the ureter. °· A kidney obstruction present at birth (congenital obstruction). °· Previous surgery on the kidney or ureters. °· Numerous kidney infections. °SYMPTOMS  °· Feeling sick to your stomach (nauseous). °· Throwing up (vomiting). °· Blood in the urine (hematuria). °· Pain that usually spreads (radiates) to the groin. °· Frequency or urgency of urination. °DIAGNOSIS  °· Taking a history and physical exam. °· Blood or urine tests. °· CT scan. °· Occasionally, an examination of the inside of the urinary bladder (cystoscopy) is performed. °TREATMENT  °· Observation. °· Increasing your fluid intake. °· Extracorporeal shock wave lithotripsy--This is a noninvasive procedure that uses shock waves to break up kidney stones. °· Surgery may be needed if you have severe pain or persistent obstruction. There are various surgical procedures. Most of the procedures are performed with the use of small instruments. Only small incisions are needed to accommodate these instruments, so recovery time is minimized. °The size, location, and chemical composition are all important variables that will determine the proper choice of action for you. Talk to your health care provider to better understand your situation so that you will minimize the risk of injury to yourself  and your kidney.  °HOME CARE INSTRUCTIONS  °· Drink enough water and fluids to keep your urine clear or pale yellow. This will help you to pass the stone or stone fragments. °· Strain all urine through the provided strainer. Keep all particulate matter and stones for your health care provider to see. The stone causing the pain may be as small as a grain of salt. It is very important to use the strainer each and every time you pass your urine. The collection of your stone will allow your health care provider to analyze it and verify that a stone has actually passed. The stone analysis will often identify what you can do to reduce the incidence of recurrences. °· Only take over-the-counter or prescription medicines for pain, discomfort, or fever as directed by your health care provider. °· Make a follow-up appointment with your health care provider as directed. °· Get follow-up X-rays if required. The absence of pain does not always mean that the stone has passed. It may have only stopped moving. If the urine remains completely obstructed, it can cause loss of kidney function or even complete destruction of the kidney. It is your responsibility to make sure X-rays and follow-ups are completed. Ultrasounds of the kidney can show blockages and the status of the kidney. Ultrasounds are not associated with any radiation and can be performed easily in a matter of minutes. °SEEK MEDICAL CARE IF: °· You experience pain that is progressive and unresponsive to any pain medicine you have been prescribed. °SEEK IMMEDIATE MEDICAL CARE IF:  °· Pain cannot be controlled with the prescribed medicine. °· You have a fever or shaking chills. °· The severity or intensity   of pain increases over 18 hours and is not relieved by pain medicine. °· You develop a new onset of abdominal pain. °· You feel faint or pass out. °· You are unable to urinate. °MAKE SURE YOU:  °· Understand these instructions. °· Will watch your condition. °· Will get  help right away if you are not doing well or get worse. °Document Released: 06/25/2005 Document Revised: 02/25/2013 Document Reviewed: 11/26/2012 °[employer]® Patient Information ©2015 [employer]. This information is not intended to replace advice given to you by your health care provider. Make sure you discuss any questions you have with your health care provider. ° °

## 2014-12-06 NOTE — ED Provider Notes (Signed)
[facility] Emergency Department Provider Note   ____________________________________________  Time seen: 1025  I have reviewed the triage vital signs and the nursing notes.   HISTORY  Chief Complaint Nephrolithiasis   History limited by: Not Limited   HPI Kirk Smith is a 39 y.o. male who presents to the emergency department with right flank pain. This became severe today. He has had some urinary problems for the past couple of days. Patient states that the pain is sharp and starts in his right flank and radiates down towards his groin. He has had similar pain in the past with his previous kidney stones. He denies any fevers. He has had nausea   Past Medical History  Diagnosis Date  . CAD (coronary artery disease)   . Tachycardia     palpitations  . HLD (hyperlipidemia)   . HTN (hypertension)   . Kidney stone   . Bulging lumbar disc     Patient Active Problem List   Diagnosis Date Noted  . Rotator cuff tear 09/23/2014  . Hyperlipidemia 08/16/2010  . Obesity 08/16/2010  . HYPERTENSION, BENIGN 08/16/2010  . Tachycardia 08/16/2010  . SHORTNESS OF BREATH 08/16/2010    Past Surgical History  Procedure Laterality Date  . Carpal tunnel release    . Carpal tunnel release Left     Current Outpatient Rx  Name  Route  Sig  Dispense  Refill  . atorvastatin (LIPITOR) 40 MG tablet   Oral   Take 1 tablet (40 mg total) by mouth daily.   90 tablet   3   . meloxicam (MOBIC) 15 MG tablet   Oral   Take 15 mg by mouth daily.         . methocarbamol (ROBAXIN) 750 MG tablet   Oral   Take 375-750 mg by mouth 3 (three) times daily as needed for muscle spasms.         . metoprolol tartrate (LOPRESSOR) 25 MG tablet   Oral   Take 1 tablet (25 mg total) by mouth 2 (two) times daily.   180 tablet   3   . Omega-3 Fatty Acids (FISH OIL) 1000 MG CAPS   Oral   Take 1 capsule by mouth daily.         . ondansetron (ZOFRAN) 4 MG tablet  Oral   Take 1 tablet (4 mg total) by mouth daily as needed for nausea or vomiting.   20 tablet   0   . oxyCODONE-acetaminophen (ROXICET) 5-325 MG per tablet   Oral   Take 1 tablet by mouth every 4 (four) hours as needed for severe pain.   15 tablet   0   . promethazine (PHENERGAN) 12.5 MG tablet   Oral   Take 1 tablet (12.5 mg total) by mouth every 6 (six) hours as needed for nausea or vomiting.   30 tablet   0   . tamsulosin (FLOMAX) 0.4 MG CAPS capsule   Oral   Take 1 capsule (0.4 mg total) by mouth daily.   15 capsule   0     Allergies Penicillins  Family History  Problem Relation Age of Onset  . Heart failure Father     CHF  . Heart disease Brother     heart problems    Social History History  Substance Use Topics  . Smoking status: Former Games developer  . Smokeless tobacco: Not on file  . Alcohol Use: No     Comment: occasional  Review of Systems  Constitutional: Negative for fever. Cardiovascular: Negative for chest pain. Respiratory: Negative for shortness of breath. Gastrointestinal: Right flank pain Genitourinary: Negative for dysuria. Musculoskeletal: Negative for back pain. Skin: Negative for rash. Neurological: Negative for headaches, focal weakness or numbness.   10-point ROS otherwise negative.  ____________________________________________   PHYSICAL EXAM:  VITAL SIGNS: ED Triage Vitals  Enc Vitals Group     BP 12/06/14 0906 120/79 mmHg     Pulse Rate 12/06/14 0906 73     Resp 12/06/14 0906 22     Temp 12/06/14 0906 97.9 F (36.6 C)     Temp Source 12/06/14 0906 Oral     SpO2 12/06/14 0906 98 %     Weight 12/06/14 0906 300 lb (136.079 kg)     Height 12/06/14 0906 5\' 11"  (1.803 m)     Head Cir --      Peak Flow --      Pain Score 12/06/14 0906 10   Constitutional: Alert and oriented. Well appearing and in no distress. Eyes: Conjunctivae are normal. PERRL. Normal extraocular movements. ENT   Head: Normocephalic and  atraumatic.   Nose: No congestion/rhinnorhea.   Mouth/Throat: Mucous membranes are moist.   Neck: No stridor. Hematological/Lymphatic/Immunilogical: No cervical lymphadenopathy. Cardiovascular: Normal rate, regular rhythm.  No murmurs, rubs, or gallops. Respiratory: Normal respiratory effort without tachypnea nor retractions. Breath sounds are clear and equal bilaterally. No wheezes/rales/rhonchi. Gastrointestinal: Soft. Minimally tender to palpation on the right side. Genitourinary: Deferred Musculoskeletal: Normal range of motion in all extremities. No joint effusions.  No lower extremity tenderness nor edema. Neurologic:  Normal speech and language. No gross focal neurologic deficits are appreciated. Speech is normal.  Skin:  Skin is warm, dry and intact. No rash noted. Psychiatric: Mood and affect are normal. Speech and behavior are normal. Patient exhibits appropriate insight and judgment.  ____________________________________________    LABS (pertinent positives/negatives)  Labs Reviewed  URINALYSIS COMPLETEWITH MICROSCOPIC ([facility] ONLY) - Abnormal; Notable for the following:    Color, Urine YELLOW (*)    APPearance HAZY (*)    Hgb urine dipstick 2+ (*)    Protein, ur >500 (*)    All other components within normal limits  BASIC METABOLIC PANEL - Abnormal; Notable for the following:    Glucose, Bld 114 (*)    All other components within normal limits  CBC     ____________________________________________   EKG  None  ____________________________________________    RADIOLOGY  KUB IMPRESSION: Negative. ____________________________________________   PROCEDURES  Procedure(s) performed: None  Critical Care performed: No  ____________________________________________   INITIAL IMPRESSION / ASSESSMENT AND PLAN / ED COURSE  Pertinent labs & imaging results that were available during my care of the patient were reviewed by me and considered in my  medical decision making (see chart for details).  Patient presented with right flank pain. Urinalysis is consistent with kidney stones. KUB did not show any obvious stone in the ureter. Bedside ultrasound showed mild hydronephrosis on the right side. Blood work with normal creatinine and white blood cell count. Will discharge patient home with Flomax, pain medication and antiemetics. Encouraged urology follow-up.  ____________________________________________   FINAL CLINICAL IMPRESSION(S) / ED DIAGNOSES  Final diagnoses:  Flank pain  Hematuria  Ureterolithiasis     Phineas SemenGraydon Azaryah Oleksy, MD 12/06/14 1512

## 2014-12-06 NOTE — ED Notes (Signed)
Pt reports that he was having urinary symptoms for about three weeks, today his right flank started hurting him. In feb or March he had a stone that was 9mm and had to get lithotripsy. He had pain medication left over from the last stone and took it at 0600 (oxycodone) without relief. Pt pale and nauseated

## 2014-12-27 ENCOUNTER — Encounter: Payer: Self-pay | Admitting: *Deleted

## 2014-12-27 ENCOUNTER — Emergency Department
Admission: EM | Admit: 2014-12-27 | Discharge: 2014-12-27 | Disposition: A | Discharge status: Home / Self Care | Payer: Worker's Compensation | Attending: Emergency Medicine | Admitting: Emergency Medicine

## 2014-12-27 DIAGNOSIS — Z7721 Contact with and (suspected) exposure to potentially hazardous body fluids: Secondary | ICD-10-CM

## 2014-12-27 DIAGNOSIS — Z79899 Other long term (current) drug therapy: Secondary | ICD-10-CM | POA: Diagnosis not present

## 2014-12-27 DIAGNOSIS — Z791 Long term (current) use of non-steroidal anti-inflammatories (NSAID): Secondary | ICD-10-CM | POA: Insufficient documentation

## 2014-12-27 DIAGNOSIS — Z880 Allergy status to penicillin: Secondary | ICD-10-CM | POA: Insufficient documentation

## 2014-12-27 DIAGNOSIS — I10 Essential (primary) hypertension: Secondary | ICD-10-CM | POA: Insufficient documentation
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
## 2014-12-27 NOTE — Discharge Instructions (Signed)
Body Fluid Exposure Information °People may come into contact with blood and other body fluids under various circumstances. In some cases, body fluids may contain germs (bacteria or viruses) that cause infections. These germs can be spread when another person's body fluids come into contact with your skin, mouth, eyes, or genitals.  °Exposure to body fluids that may contain infectious material is a common problem for people providing care for others who are ill. It can occur when a person is performing health care tasks in the workplace or when taking care of a family member at home. Other common methods of exposure include injection drug use, sharing needles, and sexual activity. °The risk of an infection spreading through body fluid exposure is small and depends on a variety of factors. This includes the type of body fluid, the nature of the exposure, and the health status of the person who was the source of the body fluids. Your health care provider can help you assess the risk. °WHAT TYPES OF BODY FLUID CAN SPREAD INFECTION? °The following types of body fluid have the potential to spread infections: °· Blood. °· Semen. °· Vaginal secretions. °· Urine. °· Feces. °· Saliva. °· Nasal or eye discharge. °· Breast milk. °· Amniotic fluid and fluids surrounding body organs. °WHAT ARE SOME FIRST-AID MEASURES FOR BODY FLUID EXPOSURE? °The following steps should be taken as soon as possible after a person is exposed to body fluids: °Intact Skin °· For contact with closed skin, wash the area with soap and water. °Broken Skin °· For contact with broken skin (a wound), wash the area with soap and water. Let the area bleed a little. Then place a bandage or clean towel on the wound, applying gentle pressure to stop the bleeding. Do not squeeze or rub the area. °· Use just water or hand sanitizer if a sink with soap is not available. °· Do not use harsh chemicals such as bleach or iodine. °Eyes °· Rinse the eyes with water or  saline for 30 seconds. °· If the person is wearing contact lenses, leave the contact lenses in while rinsing the eyes. Once the rinsing is complete, remove the contact lenses. °Mouth °· Spit out the fluids. Rinse and spit with water 4-5 times. °In addition, you should remove any clothing that comes into contact with body fluids. However, if body fluid exposure results from sexual assault, seek medical care immediately without changing clothes or bathing. °WHEN SHOULD YOU SEEK HELP? °After performing the proper first-aid steps, you should contact your health care provider or seek emergency care right away if blood or other body fluids made contact with areas of broken skin or openings such as the eyes or mouth. If the exposure to body fluid happened in the workplace, you should report it to your work supervisor immediately. Many workplaces have procedures in place for exposure situations. °WHAT WILL HAPPEN AFTER YOU REPORT THE EXPOSURE? °Your health care provider will ask you several questions. Information requested may include: °· Your medical history, including vaccination records. °· Date and time of the exposure. °· Whether you saw body fluids during the exposure. °· Type of body fluid you were exposed to. °· Volume of body fluid you were exposed to. °· How the exposure happened. °· If any devices, such as a needle, were being used. °· Which area of your body made contact with the body fluid. °· Description of any injury to the skin or other area. °· How long contact was made with the body   fluid. °· Any information you have about the health status of the person whose body fluid you were exposed to. °The health care provider will assess your risk of infection. Often, no treatment is necessary. In some cases, the health care provider may recommend doing blood tests right away. Follow-up blood tests may also be done at certain intervals during the upcoming weeks and months to check for changes. You may be offered  treatment to prevent an infection from developing after exposure (post-exposure prophylaxis). This may include certain vaccinations or medicines and may be necessary when there is a risk of a serious infection, such as HIV or hepatitis B. Your health care provider should discuss appropriate treatment and vaccinations with you. °HOW CAN YOU PREVENT EXPOSURE AND INFECTION? °Always remember that prevention is the first line of defense against body fluid exposure. To help prevent exposure to body fluids: °· Wash and disinfect countertops and other surfaces regularly. °· Wear appropriate protective gear such as gloves, gowns, or eyewear when the possibility of exposure is present. °· Wipe away spills of body fluid with disposable towels. °· Properly dispose of blood products and other fluids. Use secured bags. °· Properly dispose of needles and other instruments with sharp points or edges (sharps). Use closed, marked containers. °· Avoid injection drug use. °· Do not share needles. °· Avoid recapping needles. °· Use a condom during sexual intercourse. °· Make sure you learn and follow any guidelines for preventing exposure (universal precautions) provided at your workplace. °To help reduce your chances of getting an infection: °· Make sure your vaccinations are up-to-date, including those for tetanus and hepatitis. °· Wash your hands frequently with soap and water. Use hand sanitizers. °· Avoid having multiple sex partners. °· Follow up with your health care provider as directed after being evaluated for an exposure to body fluids. °To avoid spreading infection to others: °· Do not have sexual relations until you know you are free of infection. °· Do not donate blood, plasma, breast milk, sperm, or other body fluids. °· Do not share hygiene tools such as toothbrushes, razors, or dental floss. °· Keep open wounds covered. °· Dispose of any items with blood on them (razors, tampons, bandages) by putting them in the  trash. °· Do not share drug supplies with others, such as needles, syringes, straws, or pipes. °· Follow all of your health care provider's instructions for preventing the spread of infection. °Document Released: 02/25/2013 Document Revised: 06/30/2013 Document Reviewed: 02/25/2013 °[employer]® Patient Information ©2015 [employer]. This information is not intended to replace advice given to you by your health care provider. Make sure you discuss any questions you have with your health care provider. ° °
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
## 2015-02-14 ENCOUNTER — Other Ambulatory Visit: Payer: Self-pay

## 2015-02-14 DIAGNOSIS — N200 Calculus of kidney: Secondary | ICD-10-CM

## 2015-07-24 ENCOUNTER — Emergency Department
Admission: EM | Admit: 2015-07-24 | Discharge: 2015-07-24 | Disposition: A | Discharge status: Home / Self Care | Payer: Worker's Compensation | Attending: Emergency Medicine | Admitting: Emergency Medicine

## 2015-07-24 ENCOUNTER — Encounter: Payer: Self-pay | Admitting: Emergency Medicine

## 2015-07-24 ENCOUNTER — Emergency Department: Payer: Worker's Compensation

## 2015-07-24 DIAGNOSIS — Y998 Other external cause status: Secondary | ICD-10-CM | POA: Insufficient documentation

## 2015-07-24 DIAGNOSIS — M25461 Effusion, right knee: Secondary | ICD-10-CM | POA: Diagnosis not present

## 2015-07-24 DIAGNOSIS — S8991XA Unspecified injury of right lower leg, initial encounter: Secondary | ICD-10-CM | POA: Insufficient documentation

## 2015-07-24 DIAGNOSIS — I10 Essential (primary) hypertension: Secondary | ICD-10-CM | POA: Insufficient documentation

## 2015-07-24 DIAGNOSIS — Y9289 Other specified places as the place of occurrence of the external cause: Secondary | ICD-10-CM | POA: Diagnosis not present

## 2015-07-24 DIAGNOSIS — W1849XA Other slipping, tripping and stumbling without falling, initial encounter: Secondary | ICD-10-CM | POA: Insufficient documentation

## 2015-07-24 DIAGNOSIS — Z79899 Other long term (current) drug therapy: Secondary | ICD-10-CM | POA: Diagnosis not present

## 2015-07-24 DIAGNOSIS — Z791 Long term (current) use of non-steroidal anti-inflammatories (NSAID): Secondary | ICD-10-CM | POA: Diagnosis not present

## 2015-07-24 DIAGNOSIS — Y9389 Activity, other specified: Secondary | ICD-10-CM | POA: Insufficient documentation

## 2015-07-24 DIAGNOSIS — M25561 Pain in right knee: Secondary | ICD-10-CM

## 2015-07-24 MED ORDER — HYDROCODONE-ACETAMINOPHEN 5-325 MG PO TABS: 1.0000 | ORAL_TABLET | Freq: Four times a day (QID) | ORAL | Status: DC | PRN
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
## 2015-07-24 NOTE — Discharge Instructions (Signed)
Cryotherapy °Cryotherapy means treatment with cold. Ice or gel packs can be used to reduce both pain and swelling. Ice is the most helpful within the first 24 to 48 hours after an injury or flare-up from overusing a muscle or joint. Sprains, strains, spasms, burning pain, shooting pain, and aches can all be eased with ice. Ice can also be used when recovering from surgery. Ice is effective, has very few side effects, and is safe for most people to use. °PRECAUTIONS  °Ice is not a safe treatment option for people with: °· Raynaud phenomenon. This is a condition affecting small blood vessels in the extremities. Exposure to cold may cause your problems to return. °· Cold hypersensitivity. There are many forms of cold hypersensitivity, including: °· Cold urticaria. Red, itchy hives appear on the skin when the tissues begin to warm after being iced. °· Cold erythema. This is a red, itchy rash caused by exposure to cold. °· Cold hemoglobinuria. Red blood cells break down when the tissues begin to warm after being iced. The hemoglobin that carry oxygen are passed into the urine because they cannot combine with blood proteins fast enough. °· Numbness or altered sensitivity in the area being iced. °If you have any of the following conditions, do not use ice until you have discussed cryotherapy with your caregiver: °· Heart conditions, such as arrhythmia, angina, or chronic heart disease. °· High blood pressure. °· Healing wounds or open skin in the area being iced. °· Current infections. °· Rheumatoid arthritis. °· Poor circulation. °· Diabetes. °Ice slows the blood flow in the region it is applied. This is beneficial when trying to stop inflamed tissues from spreading irritating chemicals to surrounding tissues. However, if you expose your skin to cold temperatures for too long or without the proper protection, you can damage your skin or nerves. Watch for signs of skin damage due to cold. °HOME CARE INSTRUCTIONS °Follow  these tips to use ice and cold packs safely. °· Place a dry or damp towel between the ice and skin. A damp towel will cool the skin more quickly, so you may need to shorten the time that the ice is used. °· For a more rapid response, add gentle compression to the ice. °· Ice for no more than 10 to 20 minutes at a time. The bonier the area you are icing, the less time it will take to get the benefits of ice. °· Check your skin after 5 minutes to make sure there are no signs of a poor response to cold or skin damage. °· Rest 20 minutes or more between uses. °· Once your skin is numb, you can end your treatment. You can test numbness by very lightly touching your skin. The touch should be so light that you do not see the skin dimple from the pressure of your fingertip. When using ice, most people will feel these normal sensations in this order: cold, burning, aching, and numbness. °· Do not use ice on someone who cannot communicate their responses to pain, such as small children or people with dementia. °HOW TO MAKE AN ICE PACK °Ice packs are the most common way to use ice therapy. Other methods include ice massage, ice baths, and cryosprays. Muscle creams that cause a cold, tingly feeling do not offer the same benefits that ice offers and should not be used as a substitute unless recommended by your caregiver. °To make an ice pack, do one of the following: °· Place crushed ice or a   bag of frozen vegetables in a sealable plastic bag. Squeeze out the excess air. Place this bag inside another plastic bag. Slide the bag into a pillowcase or place a damp towel between your skin and the bag.  Mix 3 parts water with 1 part rubbing alcohol. Freeze the mixture in a sealable plastic bag. When you remove the mixture from the freezer, it will be slushy. Squeeze out the excess air. Place this bag inside another plastic bag. Slide the bag into a pillowcase or place a damp towel between your skin and the bag. SEEK MEDICAL CARE  IF:  You develop white spots on your skin. This may give the skin a blotchy (mottled) appearance.  Your skin turns blue or pale.  Your skin becomes waxy or hard.  Your swelling gets worse. MAKE SURE YOU:   Understand these instructions.  Will watch your condition.  Will get help right away if you are not doing well or get worse.   This information is not intended to replace advice given to you by your health care provider. Make sure you discuss any questions you have with your health care provider.   Document Released: 02/19/2011 Document Revised: 07/16/2014 Document Reviewed: 02/19/2011 Elsevier Interactive Patient Education 2016 [employer]  Knee Effusion Knee effusion means that you have excess fluid in your knee joint. This can cause pain and swelling in your knee. This may make your knee more difficult to bend and move. That is because there is increased pain and pressure in the joint. If there is fluid in your knee, it often means that something is wrong inside your knee, such as severe arthritis, abnormal inflammation, or an infection. Another common cause of knee effusion is an injury to the knee muscles, ligaments, or cartilage. HOME CARE INSTRUCTIONS  Use crutches as directed by your health care provider.  Wear a knee brace as directed by your health care provider.  Apply ice to the swollen area:  Put ice in a plastic bag.  Place a towel between your skin and the bag.  Leave the ice on for 20 minutes, 2-3 times per day.  Keep your knee raised (elevated) when you are sitting or lying down.  Take medicines only as directed by your health care provider.  Do any rehabilitation or strengthening exercises as directed by your health care provider.  Rest your knee as directed by your health care provider. You may start doing your normal activities again when your health care provider approves.   Keep all follow-up visits as directed by your health care provider.  This is important. SEEK MEDICAL CARE IF:  You have ongoing (persistent) pain in your knee. SEEK IMMEDIATE MEDICAL CARE IF:  You have increased swelling or redness of your knee.  You have severe pain in your knee.  You have a fever.   This information is not intended to replace advice given to you by your health care provider. Make sure you discuss any questions you have with your health care provider.   Document Released: 09/15/2003 Document Revised: 07/16/2014 Document Reviewed: 02/08/2014 Elsevier Interactive Patient Education 2016 [employer]  Knee Pain Knee pain is a very common symptom and can have many causes. Knee pain often goes away when you follow your health care provider's instructions for relieving pain and discomfort at home. However, knee pain can develop into a condition that needs treatment. Some conditions may include:  Arthritis caused by wear and tear (osteoarthritis).  Arthritis caused by swelling and irritation (  rheumatoid arthritis or gout).  A cyst or growth in your knee.  An infection in your knee joint.  An injury that will not heal.  Damage, swelling, or irritation of the tissues that support your knee (torn ligaments or tendinitis). If your knee pain continues, additional tests may be ordered to diagnose your condition. Tests may include X-rays or other imaging studies of your knee. You may also need to have fluid removed from your knee. Treatment for ongoing knee pain depends on the cause, but treatment may include:  Medicines to relieve pain or swelling.  Steroid injections in your knee.  Physical therapy.  Surgery. HOME CARE INSTRUCTIONS  Take medicines only as directed by your health care provider.  Rest your knee and keep it raised (elevated) while you are resting.  Do not do things that cause or worsen pain.  Avoid high-impact activities or exercises, such as running, jumping rope, or doing jumping jacks.  Apply ice to the knee  area:  Put ice in a plastic bag.  Place a towel between your skin and the bag.  Leave the ice on for 20 minutes, 2-3 times a day.  Ask your health care provider if you should wear an elastic knee support.  Keep a pillow under your knee when you sleep.  Lose weight if you are overweight. Extra weight can put pressure on your knee.  Do not use any tobacco products, including cigarettes, chewing tobacco, or electronic cigarettes. If you need help quitting, ask your health care provider. Smoking may slow the healing of any bone and joint problems that you may have. SEEK MEDICAL CARE IF:  Your knee pain continues, changes, or gets worse.  You have a fever along with knee pain.  Your knee buckles or locks up.  Your knee becomes more swollen. SEEK IMMEDIATE MEDICAL CARE IF:   Your knee joint feels hot to the touch.  You have chest pain or trouble breathing.   This information is not intended to replace advice given to you by your health care provider. Make sure you discuss any questions you have with your health care provider.   Document Released: 04/22/2007 Document Revised: 07/16/2014 Document Reviewed: 02/08/2014 Elsevier Interactive Patient Education [employer] Else[employer] Inc.

## 2015-07-24 NOTE — ED Notes (Signed)
W/C profile requires UDS , per pts supervisor " No UDS is needed"

## 2015-07-24 NOTE — ED Notes (Signed)
Pt states was running around a building and slipped. C/O R knee injury. Drove himself here.

## 2015-08-29 ENCOUNTER — Ambulatory Visit: Payer: Self-pay | Admitting: Urology

## 2015-09-01 ENCOUNTER — Encounter
Admission: RE | Admit: 2015-09-01 | Discharge: 2015-09-01 | Disposition: A | Discharge status: Home / Self Care | Payer: Worker's Compensation | Source: Ambulatory Visit | Attending: Orthopedic Surgery | Admitting: Orthopedic Surgery

## 2015-09-01 ENCOUNTER — Other Ambulatory Visit: Payer: Self-pay | Admitting: Orthopedic Surgery

## 2015-09-01 DIAGNOSIS — Z01812 Encounter for preprocedural laboratory examination: Secondary | ICD-10-CM | POA: Diagnosis present

## 2015-09-01 DIAGNOSIS — M1610 Unilateral primary osteoarthritis, unspecified hip: Secondary | ICD-10-CM | POA: Insufficient documentation

## 2015-09-01 LAB — COMPREHENSIVE METABOLIC PANEL
ALBUMIN: 4.1 g/dL (ref 3.5–5.0)
ALT: 41 U/L (ref 17–63)
AST: 27 U/L (ref 15–41)
Alkaline Phosphatase: 72 U/L (ref 38–126)
Anion gap: 10 (ref 5–15)
BUN: 11 mg/dL (ref 6–20)
CO2: 27 mmol/L (ref 22–32)
Calcium: 9.5 mg/dL (ref 8.9–10.3)
Chloride: 103 mmol/L (ref 101–111)
Creatinine, Ser: 0.93 mg/dL (ref 0.61–1.24)
GFR calc Af Amer: >60 mL/min (ref >60)
Glucose, Bld: 102 mg/dL — ABNORMAL HIGH (ref 65–99)
POTASSIUM: 4.2 mmol/L (ref 3.5–5.1)
Sodium: 140 mmol/L (ref 135–145)
Total Bilirubin: 0.4 mg/dL (ref 0.3–1.2)
Total Protein: 7.7 g/dL (ref 6.5–8.1)

## 2015-09-01 LAB — PROTIME-INR
INR: 0.9
Prothrombin Time: 12.4 seconds (ref 11.4–15.0)

## 2015-09-01 LAB — APTT: APTT: 27 s (ref 24–36)

## 2015-09-01 NOTE — Patient Instructions (Signed)
  Your procedure is scheduled on: 09/08/15 Report to Day Surgery. [facility] SECOND FLOOR To find out your arrival time please call [phone] between 1PM - 3PM on 09/07/15  Remember: Instructions that are not followed completely may result in serious medical risk, up to and including death, or upon the discretion of your surgeon and anesthesiologist your surgery may need to be rescheduled.    __X__ 1. Do not eat food or drink liquids after midnight. No gum chewing or hard candies.     __X__ 2. No Alcohol for 24 hours before or after surgery.   ____ 3. Bring all medications with you on the day of surgery if instructed.    _X___ 4. Notify your doctor if there is any change in your medical condition     (cold, fever, infections).     Do not wear jewelry, make-up, hairpins, clips or nail polish.  Do not wear lotions, powders, or perfumes. You may wear deodorant.  Do not shave 48 hours prior to surgery. Men may shave face and neck.  Do not bring valuables to the hospital.    [facility] is not responsible for any belongings or valuables.               Contacts, dentures or bridgework may not be worn into surgery.  Leave your suitcase in the car. After surgery it may be brought to your room.  For patients admitted to the hospital, discharge time is determined by your                treatment team.   Patients discharged the day of surgery will not be allowed to drive home.   Please read over the following fact sheets that you were given:   Surgical Site Infection Prevention   ____ Take these medicines the morning of surgery with A SIP OF WATER:    1. METOPROLOL  2.   3.   4.  5.  6.  ____ Fleet Enema (as directed)   __X__ Use CHG Soap as directed  ____ Use inhalers on the day of surgery  ____ Stop metformin 2 days prior to surgery    ____ Take 1/2 of usual insulin dose the night before surgery and none on the morning of surgery.   ____ Stop Coumadin/Plavix/aspirin  on X ____ Stop Anti-inflammatories on   NOW   _X___ Stop supplements until after surgery.  STOP FISH OIL  ____ Bring C-Pap to the hospital.

## 2015-09-08 ENCOUNTER — Ambulatory Visit: Payer: Worker's Compensation | Admitting: Certified Registered Nurse Anesthetist

## 2015-09-08 ENCOUNTER — Ambulatory Visit
Admission: RE | Admit: 2015-09-08 | Discharge: 2015-09-08 | Disposition: A | Discharge status: Home / Self Care | Payer: Worker's Compensation | Source: Ambulatory Visit | Attending: Orthopedic Surgery | Admitting: Orthopedic Surgery

## 2015-09-08 ENCOUNTER — Encounter
Admission: RE | Disposition: A | Discharge status: Home / Self Care | Payer: Self-pay | Source: Ambulatory Visit | Attending: Orthopedic Surgery

## 2015-09-08 DIAGNOSIS — F17290 Nicotine dependence, other tobacco product, uncomplicated: Secondary | ICD-10-CM | POA: Insufficient documentation

## 2015-09-08 DIAGNOSIS — R000 Tachycardia, unspecified: Secondary | ICD-10-CM | POA: Insufficient documentation

## 2015-09-08 DIAGNOSIS — Z87442 Personal history of urinary calculi: Secondary | ICD-10-CM | POA: Diagnosis not present

## 2015-09-08 DIAGNOSIS — W010XXA Fall on same level from slipping, tripping and stumbling without subsequent striking against object, initial encounter: Secondary | ICD-10-CM | POA: Diagnosis not present

## 2015-09-08 DIAGNOSIS — M5126 Other intervertebral disc displacement, lumbar region: Secondary | ICD-10-CM | POA: Insufficient documentation

## 2015-09-08 DIAGNOSIS — I10 Essential (primary) hypertension: Secondary | ICD-10-CM | POA: Diagnosis not present

## 2015-09-08 DIAGNOSIS — Z8249 Family history of ischemic heart disease and other diseases of the circulatory system: Secondary | ICD-10-CM | POA: Insufficient documentation

## 2015-09-08 DIAGNOSIS — S83261A Peripheral tear of lateral meniscus, current injury, right knee, initial encounter: Secondary | ICD-10-CM | POA: Insufficient documentation

## 2015-09-08 DIAGNOSIS — E785 Hyperlipidemia, unspecified: Secondary | ICD-10-CM | POA: Diagnosis not present

## 2015-09-08 DIAGNOSIS — R002 Palpitations: Secondary | ICD-10-CM | POA: Insufficient documentation

## 2015-09-08 DIAGNOSIS — Y939 Activity, unspecified: Secondary | ICD-10-CM | POA: Diagnosis not present

## 2015-09-08 DIAGNOSIS — I2510 Atherosclerotic heart disease of native coronary artery without angina pectoris: Secondary | ICD-10-CM | POA: Diagnosis not present

## 2015-09-08 DIAGNOSIS — I499 Cardiac arrhythmia, unspecified: Secondary | ICD-10-CM | POA: Insufficient documentation

## 2015-09-08 HISTORY — PX: KNEE ARTHROSCOPY: SHX127

## 2015-09-08 SURGERY — ARTHROSCOPY, KNEE
Anesthesia: General | Site: Knee | Laterality: Right | Wound class: Clean

## 2015-09-08 MED ORDER — LACTATED RINGERS IV SOLN
INTRAVENOUS | Status: DC
  Administered 2015-09-08: 10:00:00 via INTRAVENOUS

## 2015-09-08 MED ORDER — VANCOMYCIN HCL IN DEXTROSE 1-5 GM/200ML-% IV SOLN
1000.0000 mg | INTRAVENOUS | Status: AC
  Administered 2015-09-08: 1000 mg via INTRAVENOUS

## 2015-09-08 MED ORDER — ASPIRIN EC 325 MG PO TBEC: 325.0000 mg | DELAYED_RELEASE_TABLET | Freq: Every day | ORAL | Status: DC

## 2015-09-08 MED ORDER — LIDOCAINE HCL (CARDIAC) 20 MG/ML IV SOLN
INTRAVENOUS | Status: DC | PRN
  Administered 2015-09-08: 60 mg via INTRAVENOUS

## 2015-09-08 MED ORDER — DEXAMETHASONE SODIUM PHOSPHATE 10 MG/ML IJ SOLN
INTRAMUSCULAR | Status: DC | PRN
  Administered 2015-09-08: 5 mg via INTRAVENOUS

## 2015-09-08 MED ORDER — ACETAMINOPHEN 10 MG/ML IV SOLN
INTRAVENOUS | Status: DC | PRN
  Administered 2015-09-08: 1000 mg via INTRAVENOUS

## 2015-09-08 MED ORDER — PHENYLEPHRINE HCL 10 MG/ML IJ SOLN
INTRAMUSCULAR | Status: DC | PRN
  Administered 2015-09-08 (×7): 100 ug via INTRAVENOUS
  Administered 2015-09-08: 200 ug via INTRAVENOUS

## 2015-09-08 MED ORDER — SUCCINYLCHOLINE CHLORIDE 20 MG/ML IJ SOLN
INTRAMUSCULAR | Status: DC | PRN
  Administered 2015-09-08: 180 mg via INTRAVENOUS

## 2015-09-08 MED ORDER — LIDOCAINE HCL 1 % IJ SOLN
INTRAMUSCULAR | Status: DC | PRN
  Administered 2015-09-08: 9 mL

## 2015-09-08 MED ORDER — HYDROCODONE-ACETAMINOPHEN 5-325 MG PO TABS: 1.0000 | ORAL_TABLET | Freq: Four times a day (QID) | ORAL | Status: DC | PRN

## 2015-09-08 MED ORDER — CHLORHEXIDINE GLUCONATE 4 % EX LIQD: 1.0000 "application " | Freq: Once | CUTANEOUS | Status: DC

## 2015-09-08 MED ORDER — FAMOTIDINE 20 MG PO TABS
20.0000 mg | ORAL_TABLET | Freq: Once | ORAL | Status: AC
  Administered 2015-09-08: 20 mg via ORAL

## 2015-09-08 MED ORDER — ONDANSETRON HCL 4 MG/2ML IJ SOLN
INTRAMUSCULAR | Status: DC | PRN
  Administered 2015-09-08: 4 mg via INTRAVENOUS

## 2015-09-08 MED ORDER — LIDOCAINE HCL (PF) 1 % IJ SOLN
INTRAMUSCULAR | Status: AC
  Filled 2015-09-08: qty 30

## 2015-09-08 MED ORDER — BUPIVACAINE-EPINEPHRINE 0.25% -1:200000 IJ SOLN
INTRAMUSCULAR | Status: DC | PRN
  Administered 2015-09-08: 20 mL

## 2015-09-08 MED ORDER — HYDROMORPHONE HCL 1 MG/ML IJ SOLN: 0.2500 mg | INTRAMUSCULAR | Status: DC | PRN

## 2015-09-08 MED ORDER — ACETAMINOPHEN 10 MG/ML IV SOLN
INTRAVENOUS | Status: AC
  Filled 2015-09-08: qty 100

## 2015-09-08 MED ORDER — ONDANSETRON HCL 4 MG PO TABS: 4.0000 mg | ORAL_TABLET | Freq: Three times a day (TID) | ORAL | Status: DC | PRN

## 2015-09-08 MED ORDER — FAMOTIDINE 20 MG PO TABS
ORAL_TABLET | ORAL | Status: AC
  Administered 2015-09-08: 20 mg via ORAL
  Filled 2015-09-08: qty 1

## 2015-09-08 MED ORDER — MIDAZOLAM HCL 2 MG/2ML IJ SOLN
INTRAMUSCULAR | Status: DC | PRN
  Administered 2015-09-08: 2 mg via INTRAVENOUS

## 2015-09-08 MED ORDER — SUGAMMADEX SODIUM 500 MG/5ML IV SOLN
INTRAVENOUS | Status: DC | PRN
  Administered 2015-09-08: 300 mg via INTRAVENOUS

## 2015-09-08 MED ORDER — VANCOMYCIN HCL IN DEXTROSE 1-5 GM/200ML-% IV SOLN
INTRAVENOUS | Status: AC
  Administered 2015-09-08: 1000 mg via INTRAVENOUS
  Filled 2015-09-08: qty 200

## 2015-09-08 MED ORDER — FENTANYL CITRATE (PF) 100 MCG/2ML IJ SOLN
INTRAMUSCULAR | Status: DC | PRN
  Administered 2015-09-08: 75 ug via INTRAVENOUS

## 2015-09-08 MED ORDER — EPHEDRINE SULFATE 50 MG/ML IJ SOLN
INTRAMUSCULAR | Status: DC | PRN
  Administered 2015-09-08 (×3): 5 mg via INTRAVENOUS

## 2015-09-08 MED ORDER — BUPIVACAINE HCL (PF) 0.25 % IJ SOLN
INTRAMUSCULAR | Status: AC
  Filled 2015-09-08: qty 30

## 2015-09-08 MED ORDER — PROPOFOL 10 MG/ML IV BOLUS
INTRAVENOUS | Status: DC | PRN
  Administered 2015-09-08: 200 mg via INTRAVENOUS

## 2015-09-08 MED ORDER — ONDANSETRON HCL 4 MG/2ML IJ SOLN: 4.0000 mg | Freq: Once | INTRAMUSCULAR | Status: DC | PRN

## 2015-09-08 MED ORDER — GLYCOPYRROLATE 0.2 MG/ML IJ SOLN
INTRAMUSCULAR | Status: DC | PRN
  Administered 2015-09-08: 0.4 mg via INTRAVENOUS

## 2015-09-08 MED ORDER — FENTANYL CITRATE (PF) 100 MCG/2ML IJ SOLN: 25.0000 ug | INTRAMUSCULAR | Status: DC | PRN

## 2015-09-08 MED ORDER — ROCURONIUM BROMIDE 100 MG/10ML IV SOLN
INTRAVENOUS | Status: DC | PRN
  Administered 2015-09-08: 5 mg via INTRAVENOUS
  Administered 2015-09-08: 20 mg via INTRAVENOUS

## 2015-09-08 SURGICAL SUPPLY — 34 items
BANDAGE ACE 6X5 VEL STRL LF (GAUZE/BANDAGES/DRESSINGS) ×2 IMPLANT
BUR RADIUS 3.5 (BURR) IMPLANT
BUR RADIUS 4.0X18.5 (BURR) ×2 IMPLANT
COOLER POLAR GLACIER W/PUMP (MISCELLANEOUS) ×2 IMPLANT
DECANTER SPIKE VIAL GLASS SM (MISCELLANEOUS) ×4 IMPLANT
DRAPE IMP U-DRAPE 54X76 (DRAPES) ×2 IMPLANT
DURAPREP 26ML APPLICATOR (WOUND CARE) ×6 IMPLANT
GAUZE PETRO XEROFOAM 1X8 (MISCELLANEOUS) ×2 IMPLANT
GAUZE SPONGE 4X4 12PLY STRL (GAUZE/BANDAGES/DRESSINGS) ×2 IMPLANT
GAUZE XEROFORM 4X4 STRL (GAUZE/BANDAGES/DRESSINGS) ×2 IMPLANT
GLOVE BIOGEL PI IND STRL 9 (GLOVE) ×1 IMPLANT
GLOVE BIOGEL PI INDICATOR 9 (GLOVE) ×1
GLOVE SURG 9.0 ORTHO LTXF (GLOVE) ×2 IMPLANT
GOWN STRL REUS W/ TWL LRG LVL3 (GOWN DISPOSABLE) ×1 IMPLANT
GOWN STRL REUS W/TWL 2XL LVL3 (GOWN DISPOSABLE) ×2 IMPLANT
GOWN STRL REUS W/TWL LRG LVL3 (GOWN DISPOSABLE) ×1
IV LACTATED RINGER IRRG 3000ML (IV SOLUTION) ×2
IV LR IRRIG 3000ML ARTHROMATIC (IV SOLUTION) ×2 IMPLANT
KIT RM TURNOVER STRD PROC AR (KITS) ×2 IMPLANT
MANIFOLD NEPTUNE II (INSTRUMENTS) ×2 IMPLANT
PACK ARTHROSCOPY KNEE (MISCELLANEOUS) ×2 IMPLANT
PAD ABD DERMACEA PRESS 5X9 (GAUZE/BANDAGES/DRESSINGS) ×2 IMPLANT
PAD WRAPON POLAR KNEE (MISCELLANEOUS) ×1 IMPLANT
SET TUBE SUCT SHAVER OUTFL 24K (TUBING) ×2 IMPLANT
SOL PREP PVP 2OZ (MISCELLANEOUS) ×2
SOLUTION PREP PVP 2OZ (MISCELLANEOUS) ×1 IMPLANT
STRIP CLOSURE SKIN 1/2X4 (GAUZE/BANDAGES/DRESSINGS) ×2 IMPLANT
SUT ETHILON 4-0 (SUTURE)
SUT ETHILON 4-0 FS2 18XMFL BLK (SUTURE)
SUTURE ETHLN 4-0 FS2 18XMF BLK (SUTURE) IMPLANT
TUBING ARTHRO INFLOW-ONLY STRL (TUBING) ×2 IMPLANT
WAND HAND CNTRL MULTIVAC 50 (MISCELLANEOUS) IMPLANT
WAND HAND CNTRL MULTIVAC 90 (MISCELLANEOUS) ×2 IMPLANT
WRAPON POLAR PAD KNEE (MISCELLANEOUS) ×2

## 2015-09-08 NOTE — Op Note (Signed)
09/08/2015  2:39 PM  PATIENT:  Kirk Smith    PRE-OPERATIVE DIAGNOSIS:  O27.035K Prph tear of lat mensc, current injury, right knee, init  POST-OPERATIVE DIAGNOSIS:  Same  PROCEDURE:  ARTHROSCOPY KNEE, partial lateral menisectomy, removal plica, partial synovectomy.  SURGEON:  Thornton Park, MD  ANESTHESIA:   General  PREOPERATIVE INDICATIONS:  Kirk Smith is a  40 y.o. male with a diagnosis of S83.261A Prph tear of lat mensc, current injury, right knee, init who failed conservative measures and elected for surgical management.    I discussed the risks and benefits of surgery. The risks include but are not limited to infection, bleeding requiring blood transfusion, nerve or blood vessel injury, joint stiffness or loss of motion, persistent pain, weakness or instability, malunion, nonunion and hardware failure and the need for further surgery. Medical risks include but are not limited to DVT and pulmonary embolism, myocardial infarction, stroke, pneumonia, respiratory failure and death. Patient understood these risks and wished to proceed.   OPERATIVE FINDINGS: Lateral meniscus tear involving the body and anterior horn. Suprapatellar plica.  Diffuse synovitis.  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  OPERATIVE PROCEDURE: Patient was met in the preoperative area. He underwent a preoperative history and physical. The right leg was marked with the word yes and my initials according the hospital's correct site of surgery protocol. I explained to the patient about the operation and postoperative course. His wife and father at the bedside  I answered all questions.  Patient was brought to the OR where he was placed supine on the operative table. He underwent general anesthesia. He was prepped and draped in a sterile fashion. A timeout was performed to verify the patient's name, date of birth, medical record number, correct site of surgery correct procedure to be  performed.  Proposed arthroscopy incisions were drawn out with a surgical marker. These were pre-injected with 1% lidocaine plain. An 11 blade was used to establish an inferior lateral and inferomedial portals. The inferomedial portal was created using a 18-gauge spinal needle under direct visualization.  A full diagnostic examination of the knee was performed including the suprapatellar pouch, patellofemoral joint, medial lateral compartments as well as the medial lateral gutters, the intercondylar notch in the posterior knee.  Findings on arthroscopy included a complex lateral meniscus tear involving the body and anterior horn. A suprapatellar plica.  Diffuse synovitis.  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 . Patient had the lateral meniscal tear treated with a 4-0 resector shaver blade and straight and 90 degree biters. The meniscus was debrided until a stable rim was achieved. A chondroplasty of the lateral tibial plateau was also performed using a 4-0 resector shaver blade. A partial synovectomy was also performed using a 4-0 resector shaver blade and 90 ArthroCare wand.  The supra patella plica was also resected using the 90 ArthroCare wand.  The knee was then copiously lavaged. All arthroscopic incisions removed. The 2 arthroscopy portals were closed with 4-0 nylon. Steri-Strips were applied along with a dry sterile and compressive dressing. The patient was brought to the PACU in stable condition. I scrubbed and present the entire case and all sharp and instrument counts were correct at the conclusion the case. I spoke to the patient's family postoperatively to let them know the case it done without complication and the patient was stable in the recovery room.   Timoteo Gaul, MD

## 2015-09-08 NOTE — Anesthesia Preprocedure Evaluation (Signed)
Anesthesia Evaluation  Patient identified by MRN, date of birth, ID band Patient awake    Reviewed: Allergy & Precautions, H&P , NPO status , Patient's Chart, lab work & pertinent test results, reviewed documented beta blocker date and time   History of Anesthesia Complications Negative for: history of anesthetic complications  Airway Mallampati: III  TM Distance: >3 FB Neck ROM: full    Dental  (+) Teeth Intact   Pulmonary neg pulmonary ROS, neg shortness of breath,    Pulmonary exam normal        Cardiovascular Exercise Tolerance: Good hypertension, Pt. on medications + CAD  negative cardio ROS Normal cardiovascular exam+ dysrhythmias  Rate:Normal     Neuro/Psych negative neurological ROS  negative psych ROS   GI/Hepatic negative GI ROS, Neg liver ROS,   Endo/Other  negative endocrine ROS  Renal/GU Renal diseasenegative Renal ROS  negative genitourinary   Musculoskeletal lbp    Abdominal   Peds  Hematology negative hematology ROS (+)   Anesthesia Other Findings   Reproductive/Obstetrics negative OB ROS                             Anesthesia Physical Anesthesia Plan  ASA: III  Anesthesia Plan: General LMA   Post-op Pain Management:    Induction: Intravenous  Airway Management Planned: LMA  Additional Equipment:   Intra-op Plan:   Post-operative Plan:   Informed Consent: I have reviewed the patients History and Physical, chart, labs and discussed the procedure including the risks, benefits and alternatives for the proposed anesthesia with the patient or authorized representative who has indicated his/her understanding and acceptance.     Plan Discussed with: CRNA  Anesthesia Plan Comments:         Anesthesia Quick Evaluation

## 2015-09-08 NOTE — H&P (Signed)
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 He slipped on a wet surface twisting his right knee. Patient presents for preoperative history and physical with a diagnosis of S83.261A Prph tear of lat mensc, current injury, right knee, init. Symptoms are rated as moderate to severe, and have been worsening.  This is significantly impairing activities of daily living and his ability to perform at work.  He has failed nonoperative management.   He has elected for surgical management.   Past Medical History  Diagnosis Date  . CAD (coronary artery disease)   . Tachycardia     palpitations  . HLD (hyperlipidemia)   . HTN (hypertension)   . Kidney stone   . Bulging lumbar disc   . Kidney stone   . Dysrhythmia    Past Surgical History  Procedure Laterality Date  . Carpal tunnel release    . Carpal tunnel release Left   . Tonsillectomy    . Lithotripsy     Social History   Social History  . Marital Status: Married    Spouse Name: N/A  . Number of Children: N/A  . Years of Education: N/A   Social History Main Topics  . Smoking status: Never Smoker   . Smokeless tobacco: Current User    Types: Snuff  . Alcohol Use: 0.5 oz/week    1 Standard drinks or equivalent per week     Comment: occasional   . Drug Use: No  . Sexual Activity: Not Asked   Other Topics Concern  . None   Social History Narrative   Married, full time, does not get regular exercise.    Family History  Problem Relation Age of Onset  . Heart failure Father     CHF  . Heart disease Brother     heart problems   Allergies  Allergen Reactions  . Penicillins Other (See Comments)    Reaction: Pt states this was as a child. Pt recently took Penicillin and had no reaction.  Betsey Amen [Promethazine Hcl] Nausea And Vomiting   Prior to Admission  medications   Medication Sig Start Date End Date Taking? Authorizing Provider  atorvastatin (LIPITOR) 40 MG tablet Take 1 tablet (40 mg total) by mouth daily. 09/23/14  Yes Antonieta Iba, MD  HYDROcodone-acetaminophen (NORCO) 5-325 MG tablet Take 1 tablet by mouth every 6 (six) hours as needed for moderate pain. 07/24/15  Yes Evon Slack, PA-C  meloxicam (MOBIC) 15 MG tablet Take 15 mg by mouth daily. Weaning off   Yes Historical Provider, MD  methocarbamol (ROBAXIN) 750 MG tablet Take 375-750 mg by mouth 3 (three) times daily as needed for muscle spasms.   Yes Historical Provider, MD  metoprolol tartrate (LOPRESSOR) 25 MG tablet Take 1 tablet (25 mg total) by mouth 2 (two) times daily. 09/23/14  Yes Antonieta Iba, MD  Omega-3 Fatty Acids (FISH OIL) 1000 MG CAPS Take 1 capsule by mouth daily.   Yes Historical Provider, MD     Positive ROS: All other systems have been reviewed and were otherwise negative with the exception of those mentioned in the HPI and as above.  Physical Exam: General: Alert, no acute distress Cardiovascular: Regular rate and rhythm, no murmurs rubs or gallops.  No pedal edema Respiratory: Clear to auscultation bilaterally, no wheezes rales or  rhonchi. No cyanosis, no use of accessory musculature GI: No organomegaly, abdomen is soft and non-tender nondistended with positive bowel sounds. Skin: Skin intact, no lesions within the operative field. Neurologic: Sensation intact distally Psychiatric: Patient is competent for consent with normal mood and affect Lymphatic: No cervical lymphadenopathy  MUSCULOSKELETAL: Right knee: Patient has no erythema or ecchymosis or effusion. His knee range of motion is from 0-110. He has tenderness along the lateral joint line and pain with McMurray's testing laterally. He has 5 out of 5 strength, intact sensation light touch and palpable pedal pulses. There is no calf tenderness or lower leg edema.   Assessment: S83.261A Prph tear  of lat mensc, current injury, right knee, init  Plan: Plan for Procedure(s): RIGHT KNEE ARTHROSCOPIC LATERAL MENISCECTOMY  I have discussed previously in the office the findings and the patient's MRI which includes a lateral meniscus tear. Patient is having tenderness over the lateral joint line as well as pain with weightbearing. 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 I recommended arthroscopic partial lateral meniscectomy. I explained this procedure in detail as well as the postoperative course. Patient agrees with the plan for surgery.  I also discussed the risks and benefits of surgery with the patient. He is aware the risks include but are not limited to infection, bleeding requiring blood transfusion, nerve or blood vessel injury, joint stiffness or loss of motion, persistent pain, weakness or instability, malunion, nonunion and hardware failure and the need for further surgery. Medical risks include but are not limited to DVT and pulmonary embolism, myocardial infarction, stroke, pneumonia, respiratory failure and death. Patient understood these risks and wished to proceed.     Juanell Fairly, MD   09/08/2015 12:37 PM

## 2015-09-08 NOTE — Transfer of Care (Signed)
Immediate Anesthesia Transfer of Care Note  Patient: Kirk Smith  Procedure(s) Performed: Procedure(s): ARTHROSCOPY KNEE, partial lateral menisectomy, removal plica, partial synovectomy. (Right)  Patient Location: PACU  Anesthesia Type:General  Level of Consciousness: sedated  Airway & Oxygen Therapy: Patient Spontanous Breathing and Patient connected to face mask oxygen  Post-op Assessment: Report given to RN and Post -op Vital signs reviewed and stable  Post vital signs: Reviewed and stable  Last Vitals:  Filed Vitals:   09/08/15 1436 09/08/15 1440  BP:  129/62  Pulse:  100  Temp:  36.3 C  Resp: 0 21    Complications: No apparent anesthesia complications

## 2015-09-08 NOTE — OR Nursing (Signed)
Labs called to Dr Martha Clan

## 2015-09-08 NOTE — Anesthesia Procedure Notes (Signed)
Procedure Name: Intubation Date/Time: 09/08/2015 12:59 PM Performed by: Ginger Carne Pre-anesthesia Checklist: Patient identified, Emergency Drugs available, Suction available, Patient being monitored and Timeout performed Patient Re-evaluated:Patient Re-evaluated prior to inductionOxygen Delivery Method: Circle system utilized Preoxygenation: Pre-oxygenation with 100% oxygen Intubation Type: IV induction and Rapid sequence Ventilation: Mask ventilation with difficulty, Two handed mask ventilation required and Oral airway inserted - appropriate to patient size Laryngoscope Size: McGraph and 3 Grade View: Grade II Tube type: Oral Tube size: 7.5 mm Number of attempts: 1 Airway Equipment and Method: Patient positioned with wedge pillow and Stylet Placement Confirmation: ETT inserted through vocal cords under direct vision,  positive ETCO2 and breath sounds checked- equal and bilateral Secured at: 24 cm Tube secured with: Tape Dental Injury: Teeth and Oropharynx as per pre-operative assessment  Difficulty Due To: Difficulty was anticipated, Difficult Airway- due to limited oral opening and Difficult Airway- due to large tongue Future Recommendations: Recommend- induction with short-acting agent, and alternative techniques readily available

## 2015-09-08 NOTE — Anesthesia Postprocedure Evaluation (Signed)
Anesthesia Post Note  Patient: Kirk Smith  Procedure(s) Performed: Procedure(s) (LRB): ARTHROSCOPY KNEE, partial lateral menisectomy, removal plica, partial synovectomy. (Right)  Patient location during evaluation: PACU Anesthesia Type: General Level of consciousness: awake and alert Pain management: pain level controlled Vital Signs Assessment: post-procedure vital signs reviewed and stable Respiratory status: spontaneous breathing, nonlabored ventilation, respiratory function stable and patient connected to nasal cannula oxygen Cardiovascular status: blood pressure returned to baseline and stable Postop Assessment: no signs of nausea or vomiting Anesthetic complications: no    Last Vitals:  Filed Vitals:   09/08/15 1440 09/08/15 1454  BP: 129/62   Pulse: 100 90  Temp: 36.3 C   Resp: 21 22    Last Pain:  Filed Vitals:   09/08/15 1455  PainSc: 1                  Yevette Edwards

## 2015-09-15 DIAGNOSIS — M79645 Pain in left finger(s): Secondary | ICD-10-CM | POA: Insufficient documentation

## 2015-09-15 DIAGNOSIS — Z840 Family history of diseases of the skin and subcutaneous tissue: Secondary | ICD-10-CM | POA: Insufficient documentation

## 2015-09-15 DIAGNOSIS — Z8269 Family history of other diseases of the musculoskeletal system and connective tissue: Secondary | ICD-10-CM | POA: Insufficient documentation

## 2015-10-10 ENCOUNTER — Encounter: Payer: Self-pay | Admitting: Cardiovascular Disease

## 2015-10-10 ENCOUNTER — Ambulatory Visit (INDEPENDENT_AMBULATORY_CARE_PROVIDER_SITE_OTHER): Payer: BLUE CROSS/BLUE SHIELD | Admitting: Cardiovascular Disease

## 2015-10-10 VITALS — BP 126/88 | HR 74 | Ht 71.0 in | Wt 336.5 lb

## 2015-10-10 DIAGNOSIS — E669 Obesity, unspecified: Secondary | ICD-10-CM | POA: Diagnosis not present

## 2015-10-10 DIAGNOSIS — I10 Essential (primary) hypertension: Secondary | ICD-10-CM | POA: Diagnosis not present

## 2015-10-10 DIAGNOSIS — E785 Hyperlipidemia, unspecified: Secondary | ICD-10-CM

## 2015-10-10 DIAGNOSIS — R000 Tachycardia, unspecified: Secondary | ICD-10-CM

## 2015-10-10 NOTE — Assessment & Plan Note (Signed)
Blood pressure is well controlled on today's visit. No changes made to the medications. 

## 2015-10-10 NOTE — Assessment & Plan Note (Signed)
We have encouraged continued exercise, careful diet management in an effort to lose weight. Discussed various diets and ways to avoid binging late at night

## 2015-10-10 NOTE — Assessment & Plan Note (Signed)
Cholesterol per the patient's report is reasonable, previously was 140 We have suggested he have the [facility]city medical office forward his labs for our review

## 2015-10-10 NOTE — Assessment & Plan Note (Signed)
Heart rate well controlled on metoprolol, no changes to the dosing

## 2015-10-10 NOTE — Patient Instructions (Signed)
You are doing well. No medication changes were made.  Please call us if you have new issues that need to be addressed before your next appt.  Your physician wants you to follow-up in: 12 months.  You will receive a reminder letter in the mail two months in advance. If you don't receive a letter, please call our office to schedule the follow-up appointment. 

## 2015-10-10 NOTE — Progress Notes (Signed)
Patient ID: Kirk MarrowDavid W Smith, male    DOB: 02/26/1976, 40 y.o.   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, hypertension, hyperlipidemia remote history of tachycardia and palpitations with a strong family history of coronary artery disease who presents for routine followup of his hyperlipidemia and hypertension  In follow-up today, he reports his weight is up Eats out at fast food for many of his meals as he is always on the road with his children playing sports Also eats lots of food right before bed, sweet tooth  Reports his lab work has been done through the city, Cholesterol up very mildly otherwise reasonable Sugars borderline but still not diabetic, he is a nonsmoker  Significant injuries since his last clinic visit, had a meniscus tear right knee, now with rotator cuff tear on the right will need surgery  EKG on today's visit shows normal sinus rhythm with rate 74 bpm, no significant ST or T-wave changes   Other past medical history  Previous echocardiogram was essentially normal.   Allergies  Allergen Reactions  . Penicillins Other (See Comments)    Reaction: Pt states this was as a child. Pt recently took Penicillin and had no reaction.  Betsey Amen. Phenergan [Promethazine Hcl] Nausea And Vomiting  . Promethazine Nausea And Vomiting    Outpatient Encounter Prescriptions as of 10/10/2015  Medication Sig  . aspirin 325 MG tablet Take 325 mg by mouth as directed.  Marland Kitchen. atorvastatin (LIPITOR) 40 MG tablet Take 1 tablet (40 mg total) by mouth daily.  . meloxicam (MOBIC) 15 MG tablet Take 15 mg by mouth daily. Weaning off  . methocarbamol (ROBAXIN) 750 MG tablet Take 375-750 mg by mouth 3 (three) times daily as needed for muscle spasms.  . metoprolol tartrate (LOPRESSOR) 25 MG tablet Take 1 tablet (25 mg total) by mouth 2 (two) times daily.  . Omega-3 Fatty Acids (FISH OIL) 1000 MG CAPS Take 1 capsule by mouth daily.  .  ondansetron (ZOFRAN) 4 MG tablet Take 1 tablet (4 mg total) by mouth every 8 (eight) hours as needed for nausea or vomiting.  . [DISCONTINUED] aspirin EC 325 MG tablet Take 1 tablet (325 mg total) by mouth daily. (Patient not taking: Reported on 10/10/2015)  . [DISCONTINUED] HYDROcodone-acetaminophen (NORCO) 5-325 MG tablet Take 1 tablet by mouth every 6 (six) hours as needed for moderate pain. (Patient not taking: Reported on 10/10/2015)  . [DISCONTINUED] HYDROcodone-acetaminophen (NORCO) 5-325 MG tablet Take 1-2 tablets by mouth every 6 (six) hours as needed for moderate pain. MAXIMUM TOTAL ACETAMINOPHEN DOSE IS 4000 MG PER DAY (Patient not taking: Reported on 10/10/2015)   No facility-administered encounter medications on file as of 10/10/2015.    Past Medical History  Diagnosis Date  . CAD (coronary artery disease)   . Tachycardia     palpitations  . HLD (hyperlipidemia)   . HTN (hypertension)   . Kidney stone   . Bulging lumbar disc   . Kidney stone   . Dysrhythmia     Past Surgical History  Procedure Laterality Date  . Carpal tunnel release    . Carpal tunnel release Left   . Tonsillectomy    . Lithotripsy    . Knee arthroscopy Right 09/08/2015    Procedure: ARTHROSCOPY KNEE, partial lateral menisectomy, removal plica, partial synovectomy.;  Surgeon: Juanell FairlyKevin Krasinski, MD;  Location: [facility]S;  Service: Orthopedics;  Laterality: Right;    Social History  reports that he has never smoked. His smokeless tobacco use includes Snuff. He reports that he drinks about 0.5 oz of alcohol per week. He reports that he does not use illicit drugs.  Family History family history includes Heart disease in his brother; Heart failure in his father.  Review of Systems  Constitutional: Negative.   Respiratory: Negative.   Cardiovascular: Negative.   Gastrointestinal: Negative.   Musculoskeletal: Negative.   Skin: Negative.   Neurological: Negative.   Hematological: Negative.    Psychiatric/Behavioral: Negative.   All other systems reviewed and are negative.   BP 126/88 mmHg  Pulse 74  Ht  (1.803 m)  Wt 336 lb 8 oz (152.635 kg)  BMI 46.95 kg/m2  Physical Exam  Constitutional: He is oriented to person, place, and time. He appears well-developed and well-nourished.  obese  HENT:  Head: Normocephalic.  Nose: Nose normal.  Mouth/Throat: Oropharynx is clear and moist.  Eyes: Conjunctivae are normal. Pupils are equal, round, and reactive to light.  Neck: Normal range of motion. Neck supple. No JVD present.  Cardiovascular: Normal rate, regular rhythm, S1 normal, S2 normal, normal heart sounds and intact distal pulses.  Exam reveals no gallop and no friction rub.   No murmur heard. Pulmonary/Chest: Effort normal and breath sounds normal. No respiratory distress. He has no wheezes. He has no rales. He exhibits no tenderness.  Abdominal: Soft. Bowel sounds are normal. He exhibits no distension. There is no tenderness.  Musculoskeletal: Normal range of motion. He exhibits no edema or tenderness.  Lymphadenopathy:    He has no cervical adenopathy.  Neurological: He is alert and oriented to person, place, and time. Coordination normal.  Skin: Skin is warm and dry. No rash noted. No erythema.  Psychiatric: He has a normal mood and affect. His behavior is normal. Judgment and thought content normal.      Assessment and Plan   Nursing note and vitals reviewed.

## 2015-10-11 ENCOUNTER — Encounter: Payer: Self-pay | Admitting: Cardiovascular Disease

## 2015-10-12 ENCOUNTER — Other Ambulatory Visit: Payer: Self-pay | Admitting: Cardiovascular Disease

## 2015-10-25 DIAGNOSIS — M779 Enthesopathy, unspecified: Secondary | ICD-10-CM | POA: Diagnosis not present

## 2015-10-25 DIAGNOSIS — S46111S Strain of muscle, fascia and tendon of long head of biceps, right arm, sequela: Secondary | ICD-10-CM | POA: Diagnosis not present

## 2015-10-25 DIAGNOSIS — M7551 Bursitis of right shoulder: Secondary | ICD-10-CM | POA: Diagnosis not present

## 2015-10-25 DIAGNOSIS — S43431A Superior glenoid labrum lesion of right shoulder, initial encounter: Secondary | ICD-10-CM | POA: Diagnosis not present

## 2015-10-25 DIAGNOSIS — S43401A Unspecified sprain of right shoulder joint, initial encounter: Secondary | ICD-10-CM | POA: Diagnosis not present

## 2015-10-25 DIAGNOSIS — I10 Essential (primary) hypertension: Secondary | ICD-10-CM | POA: Diagnosis not present

## 2015-10-25 DIAGNOSIS — M75101 Unspecified rotator cuff tear or rupture of right shoulder, not specified as traumatic: Secondary | ICD-10-CM | POA: Diagnosis not present

## 2015-10-25 DIAGNOSIS — Z79899 Other long term (current) drug therapy: Secondary | ICD-10-CM | POA: Diagnosis not present

## 2015-10-25 DIAGNOSIS — G8918 Other acute postprocedural pain: Secondary | ICD-10-CM | POA: Diagnosis not present

## 2015-10-25 DIAGNOSIS — M25711 Osteophyte, right shoulder: Secondary | ICD-10-CM | POA: Diagnosis not present

## 2015-10-25 DIAGNOSIS — S46111A Strain of muscle, fascia and tendon of long head of biceps, right arm, initial encounter: Secondary | ICD-10-CM | POA: Diagnosis not present

## 2015-10-25 DIAGNOSIS — M75111 Incomplete rotator cuff tear or rupture of right shoulder, not specified as traumatic: Secondary | ICD-10-CM | POA: Diagnosis not present

## 2015-10-25 DIAGNOSIS — M659 Synovitis and tenosynovitis, unspecified: Secondary | ICD-10-CM | POA: Diagnosis not present

## 2015-10-25 DIAGNOSIS — M7541 Impingement syndrome of right shoulder: Secondary | ICD-10-CM | POA: Diagnosis not present

## 2015-11-02 DIAGNOSIS — M25611 Stiffness of right shoulder, not elsewhere classified: Secondary | ICD-10-CM | POA: Diagnosis not present

## 2015-11-02 DIAGNOSIS — M25511 Pain in right shoulder: Secondary | ICD-10-CM | POA: Diagnosis not present

## 2015-11-04 DIAGNOSIS — M25611 Stiffness of right shoulder, not elsewhere classified: Secondary | ICD-10-CM | POA: Diagnosis not present

## 2015-11-04 DIAGNOSIS — M25511 Pain in right shoulder: Secondary | ICD-10-CM | POA: Diagnosis not present

## 2015-11-07 DIAGNOSIS — M25511 Pain in right shoulder: Secondary | ICD-10-CM | POA: Diagnosis not present

## 2015-11-07 DIAGNOSIS — M25611 Stiffness of right shoulder, not elsewhere classified: Secondary | ICD-10-CM | POA: Diagnosis not present

## 2015-11-11 DIAGNOSIS — M25511 Pain in right shoulder: Secondary | ICD-10-CM | POA: Diagnosis not present

## 2015-11-11 DIAGNOSIS — M25611 Stiffness of right shoulder, not elsewhere classified: Secondary | ICD-10-CM | POA: Diagnosis not present

## 2015-11-14 DIAGNOSIS — M25511 Pain in right shoulder: Secondary | ICD-10-CM | POA: Diagnosis not present

## 2015-11-14 DIAGNOSIS — M25611 Stiffness of right shoulder, not elsewhere classified: Secondary | ICD-10-CM | POA: Diagnosis not present

## 2015-11-17 DIAGNOSIS — M25611 Stiffness of right shoulder, not elsewhere classified: Secondary | ICD-10-CM | POA: Diagnosis not present

## 2015-11-17 DIAGNOSIS — M25511 Pain in right shoulder: Secondary | ICD-10-CM | POA: Diagnosis not present

## 2015-11-21 DIAGNOSIS — M25611 Stiffness of right shoulder, not elsewhere classified: Secondary | ICD-10-CM | POA: Diagnosis not present

## 2015-11-21 DIAGNOSIS — M25511 Pain in right shoulder: Secondary | ICD-10-CM | POA: Diagnosis not present

## 2015-11-23 DIAGNOSIS — M25611 Stiffness of right shoulder, not elsewhere classified: Secondary | ICD-10-CM | POA: Diagnosis not present

## 2015-11-23 DIAGNOSIS — M25511 Pain in right shoulder: Secondary | ICD-10-CM | POA: Diagnosis not present

## 2015-11-28 DIAGNOSIS — M25611 Stiffness of right shoulder, not elsewhere classified: Secondary | ICD-10-CM | POA: Diagnosis not present

## 2015-11-28 DIAGNOSIS — M25511 Pain in right shoulder: Secondary | ICD-10-CM | POA: Diagnosis not present

## 2015-12-01 DIAGNOSIS — M25511 Pain in right shoulder: Secondary | ICD-10-CM | POA: Diagnosis not present

## 2015-12-01 DIAGNOSIS — M25611 Stiffness of right shoulder, not elsewhere classified: Secondary | ICD-10-CM | POA: Diagnosis not present

## 2015-12-03 ENCOUNTER — Other Ambulatory Visit: Payer: Self-pay | Admitting: Cardiovascular Disease

## 2015-12-07 DIAGNOSIS — M25511 Pain in right shoulder: Secondary | ICD-10-CM | POA: Diagnosis not present

## 2015-12-07 DIAGNOSIS — M25611 Stiffness of right shoulder, not elsewhere classified: Secondary | ICD-10-CM | POA: Diagnosis not present

## 2015-12-12 DIAGNOSIS — M25511 Pain in right shoulder: Secondary | ICD-10-CM | POA: Diagnosis not present

## 2015-12-12 DIAGNOSIS — M25611 Stiffness of right shoulder, not elsewhere classified: Secondary | ICD-10-CM | POA: Diagnosis not present

## 2015-12-15 DIAGNOSIS — M25611 Stiffness of right shoulder, not elsewhere classified: Secondary | ICD-10-CM | POA: Diagnosis not present

## 2015-12-15 DIAGNOSIS — M25511 Pain in right shoulder: Secondary | ICD-10-CM | POA: Diagnosis not present

## 2015-12-20 DIAGNOSIS — M25611 Stiffness of right shoulder, not elsewhere classified: Secondary | ICD-10-CM | POA: Diagnosis not present

## 2015-12-20 DIAGNOSIS — M6530 Trigger finger, unspecified finger: Secondary | ICD-10-CM | POA: Diagnosis not present

## 2015-12-20 DIAGNOSIS — M25511 Pain in right shoulder: Secondary | ICD-10-CM | POA: Diagnosis not present

## 2015-12-21 DIAGNOSIS — M25561 Pain in right knee: Secondary | ICD-10-CM | POA: Diagnosis not present

## 2015-12-21 DIAGNOSIS — M25661 Stiffness of right knee, not elsewhere classified: Secondary | ICD-10-CM | POA: Diagnosis not present

## 2015-12-26 DIAGNOSIS — M25611 Stiffness of right shoulder, not elsewhere classified: Secondary | ICD-10-CM | POA: Diagnosis not present

## 2015-12-26 DIAGNOSIS — M25511 Pain in right shoulder: Secondary | ICD-10-CM | POA: Diagnosis not present

## 2015-12-29 DIAGNOSIS — M25511 Pain in right shoulder: Secondary | ICD-10-CM | POA: Diagnosis not present

## 2015-12-29 DIAGNOSIS — M25611 Stiffness of right shoulder, not elsewhere classified: Secondary | ICD-10-CM | POA: Diagnosis not present

## 2016-01-03 DIAGNOSIS — M25611 Stiffness of right shoulder, not elsewhere classified: Secondary | ICD-10-CM | POA: Diagnosis not present

## 2016-01-03 DIAGNOSIS — M25511 Pain in right shoulder: Secondary | ICD-10-CM | POA: Diagnosis not present

## 2016-01-05 DIAGNOSIS — M25611 Stiffness of right shoulder, not elsewhere classified: Secondary | ICD-10-CM | POA: Diagnosis not present

## 2016-01-05 DIAGNOSIS — M25511 Pain in right shoulder: Secondary | ICD-10-CM | POA: Diagnosis not present

## 2016-01-12 DIAGNOSIS — M6281 Muscle weakness (generalized): Secondary | ICD-10-CM | POA: Diagnosis not present

## 2016-01-16 DIAGNOSIS — M6281 Muscle weakness (generalized): Secondary | ICD-10-CM | POA: Diagnosis not present

## 2016-01-19 DIAGNOSIS — M25511 Pain in right shoulder: Secondary | ICD-10-CM | POA: Diagnosis not present

## 2016-01-19 DIAGNOSIS — M25611 Stiffness of right shoulder, not elsewhere classified: Secondary | ICD-10-CM | POA: Diagnosis not present

## 2016-01-23 DIAGNOSIS — M25611 Stiffness of right shoulder, not elsewhere classified: Secondary | ICD-10-CM | POA: Diagnosis not present

## 2016-01-23 DIAGNOSIS — M25511 Pain in right shoulder: Secondary | ICD-10-CM | POA: Diagnosis not present

## 2016-01-25 DIAGNOSIS — M25611 Stiffness of right shoulder, not elsewhere classified: Secondary | ICD-10-CM | POA: Diagnosis not present

## 2016-01-25 DIAGNOSIS — M25511 Pain in right shoulder: Secondary | ICD-10-CM | POA: Diagnosis not present

## 2016-01-30 DIAGNOSIS — M25611 Stiffness of right shoulder, not elsewhere classified: Secondary | ICD-10-CM | POA: Diagnosis not present

## 2016-01-30 DIAGNOSIS — M25511 Pain in right shoulder: Secondary | ICD-10-CM | POA: Diagnosis not present

## 2016-02-03 DIAGNOSIS — M6281 Muscle weakness (generalized): Secondary | ICD-10-CM | POA: Diagnosis not present

## 2016-02-08 DIAGNOSIS — M25511 Pain in right shoulder: Secondary | ICD-10-CM | POA: Diagnosis not present

## 2016-02-08 DIAGNOSIS — M25611 Stiffness of right shoulder, not elsewhere classified: Secondary | ICD-10-CM | POA: Diagnosis not present

## 2016-02-10 DIAGNOSIS — M25611 Stiffness of right shoulder, not elsewhere classified: Secondary | ICD-10-CM | POA: Diagnosis not present

## 2016-02-10 DIAGNOSIS — M25511 Pain in right shoulder: Secondary | ICD-10-CM | POA: Diagnosis not present

## 2016-02-14 DIAGNOSIS — M25611 Stiffness of right shoulder, not elsewhere classified: Secondary | ICD-10-CM | POA: Diagnosis not present

## 2016-02-14 DIAGNOSIS — M25511 Pain in right shoulder: Secondary | ICD-10-CM | POA: Diagnosis not present

## 2016-02-15 DIAGNOSIS — H5202 Hypermetropia, left eye: Secondary | ICD-10-CM | POA: Diagnosis not present

## 2016-02-17 DIAGNOSIS — M25611 Stiffness of right shoulder, not elsewhere classified: Secondary | ICD-10-CM | POA: Diagnosis not present

## 2016-02-17 DIAGNOSIS — M25511 Pain in right shoulder: Secondary | ICD-10-CM | POA: Diagnosis not present

## 2016-02-21 DIAGNOSIS — M25611 Stiffness of right shoulder, not elsewhere classified: Secondary | ICD-10-CM | POA: Diagnosis not present

## 2016-02-21 DIAGNOSIS — M25511 Pain in right shoulder: Secondary | ICD-10-CM | POA: Diagnosis not present

## 2016-02-24 DIAGNOSIS — M25611 Stiffness of right shoulder, not elsewhere classified: Secondary | ICD-10-CM | POA: Diagnosis not present

## 2016-02-24 DIAGNOSIS — M25511 Pain in right shoulder: Secondary | ICD-10-CM | POA: Diagnosis not present

## 2016-03-02 DIAGNOSIS — M25611 Stiffness of right shoulder, not elsewhere classified: Secondary | ICD-10-CM | POA: Diagnosis not present

## 2016-03-02 DIAGNOSIS — M25511 Pain in right shoulder: Secondary | ICD-10-CM | POA: Diagnosis not present

## 2016-03-09 DIAGNOSIS — M25511 Pain in right shoulder: Secondary | ICD-10-CM | POA: Diagnosis not present

## 2016-03-09 DIAGNOSIS — M25611 Stiffness of right shoulder, not elsewhere classified: Secondary | ICD-10-CM | POA: Diagnosis not present

## 2016-03-16 DIAGNOSIS — M75101 Unspecified rotator cuff tear or rupture of right shoulder, not specified as traumatic: Secondary | ICD-10-CM | POA: Diagnosis not present

## 2016-08-21 IMAGING — CT CT ABD-PELV W/O CM
2 of 4 series · 16 of 46 positions shown, 18 images · non-contrast
Comparison: Multiple exams, including 11/01/2009 and 03/28/2009

CLINICAL DATA: Right flank for 1 hr.  History of renal calculi.

EXAM:
CT ABDOMEN AND PELVIS WITHOUT CONTRAST
TECHNIQUE: Multidetector CT imaging of the abdomen and pelvis was performed
following the standard protocol without IV contrast.

[Series 2: stone standard full · axial · 0.91mm/px · z∈[-1617,-1102]mm · 13 of 113 slices shown, 15 images]
[im 5/113  soft-tissue]
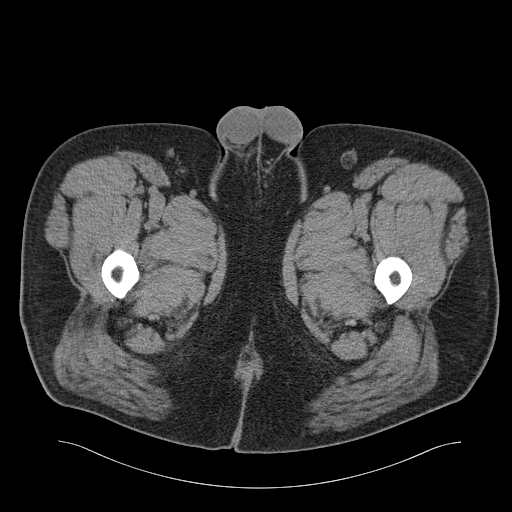
[im 5/113  bone]
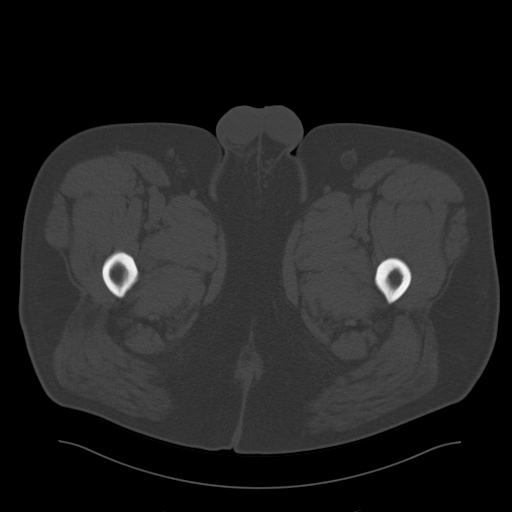
[im 15/113  soft-tissue]
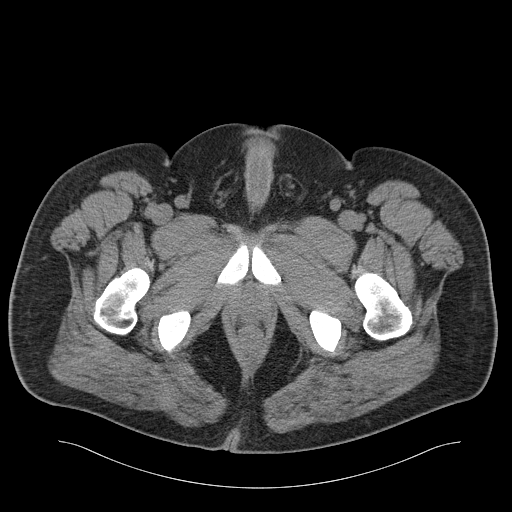
[im 24/113  soft-tissue]
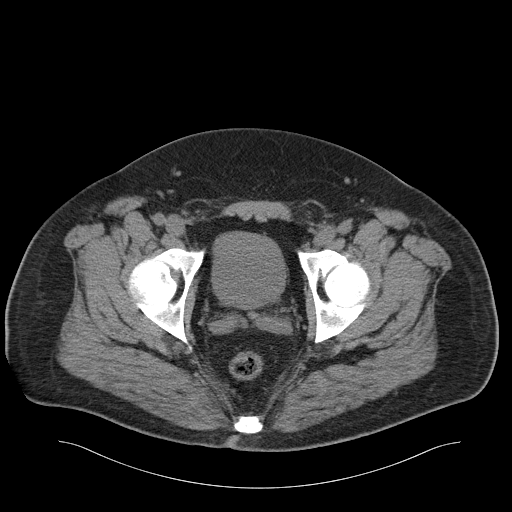
[im 33/113  soft-tissue]
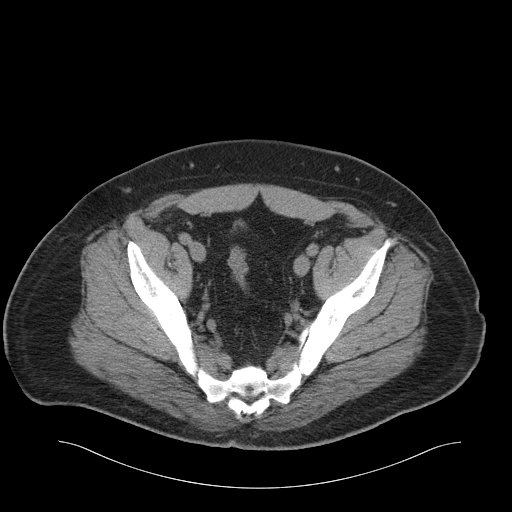
[im 38/113  soft-tissue]
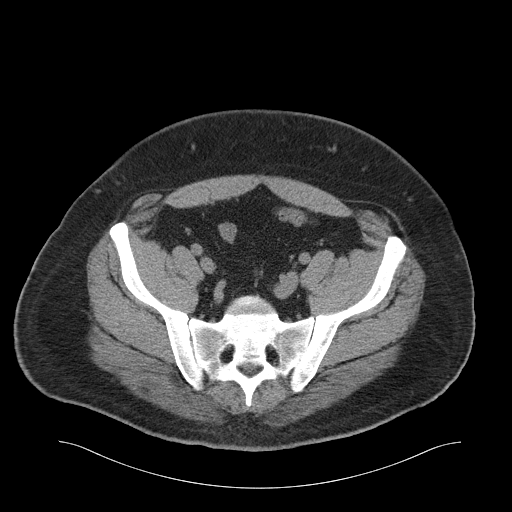
[im 47/113  soft-tissue]
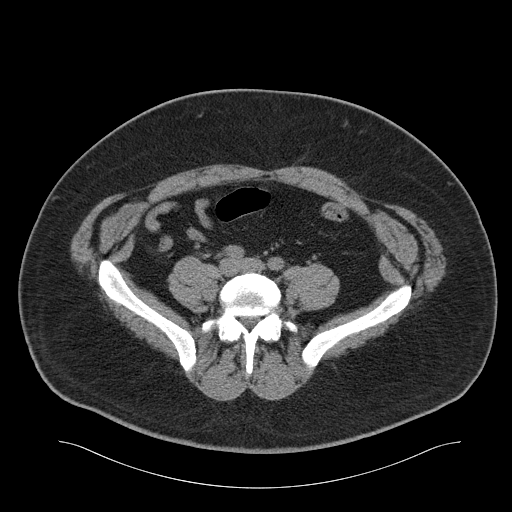
[im 57/113  soft-tissue]
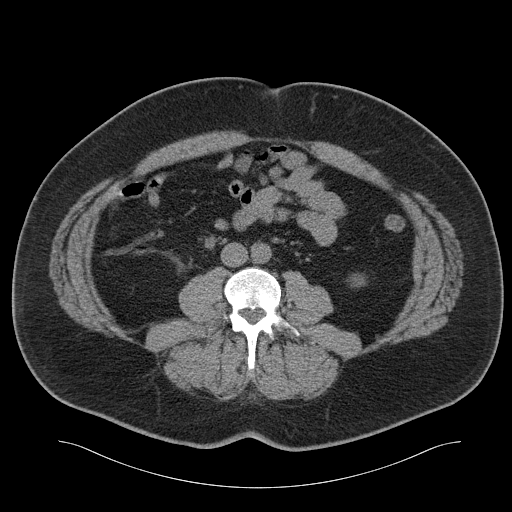
[im 66/113  soft-tissue]
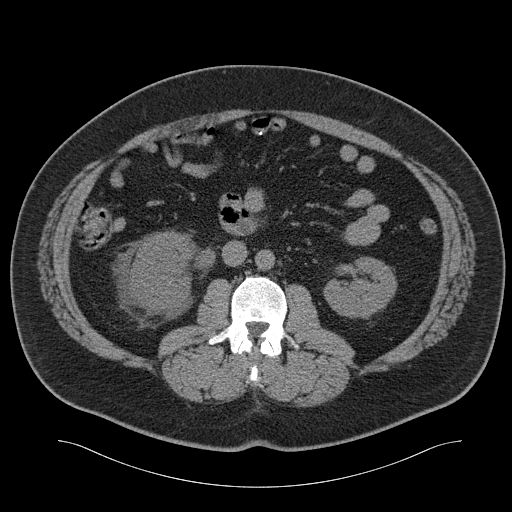
[im 75/113  soft-tissue]
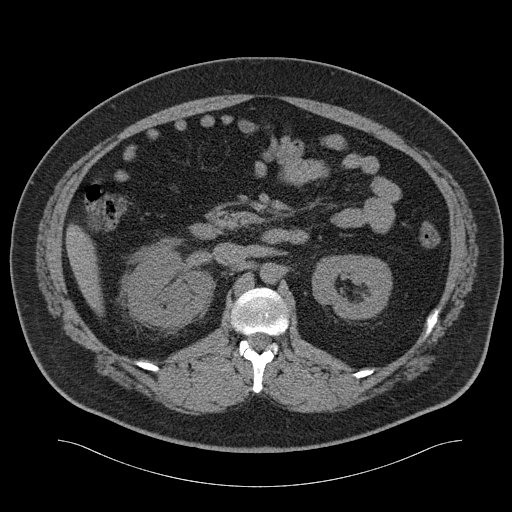
[im 75/113  bone]
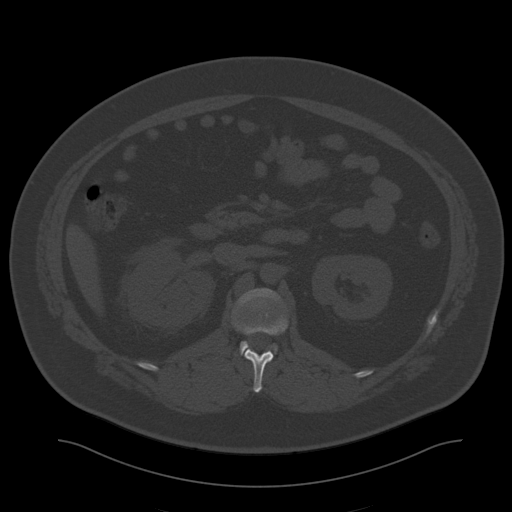
[im 80/113  soft-tissue]
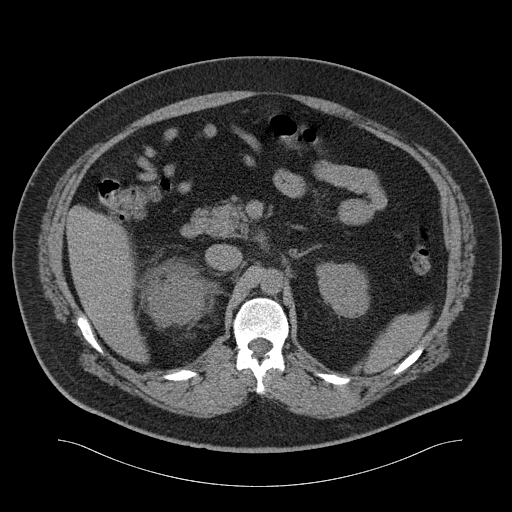
[im 89/113  soft-tissue]
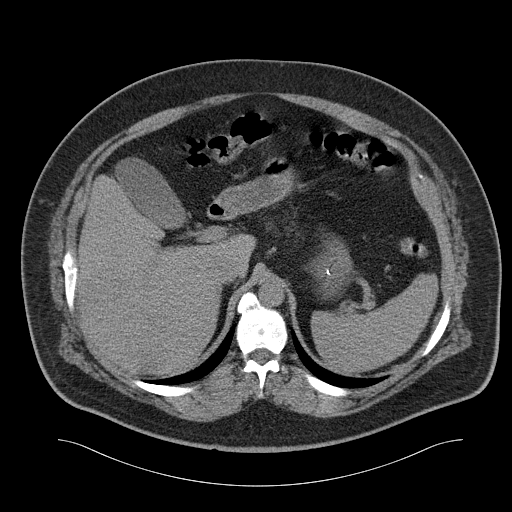
[im 99/113  soft-tissue]
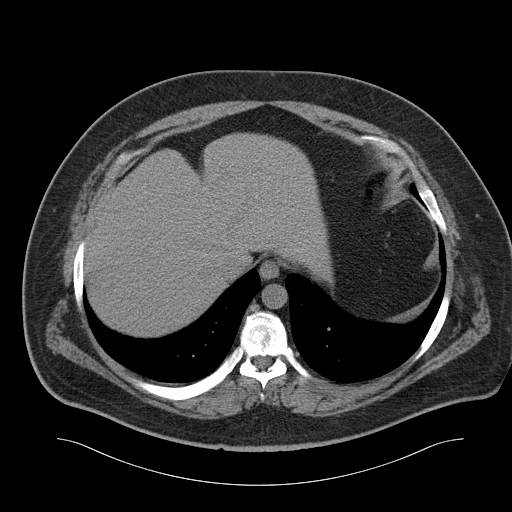
[im 108/113  soft-tissue]
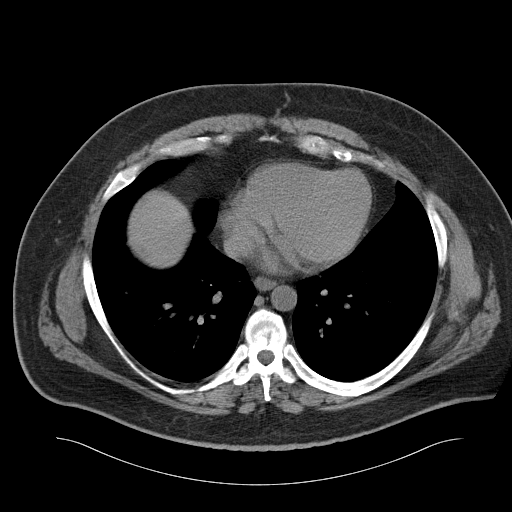

[Series 5: cor stone standard full · coronal · 0.98mm/px · 3 of 175 slices shown]
[im 59/175  soft-tissue]
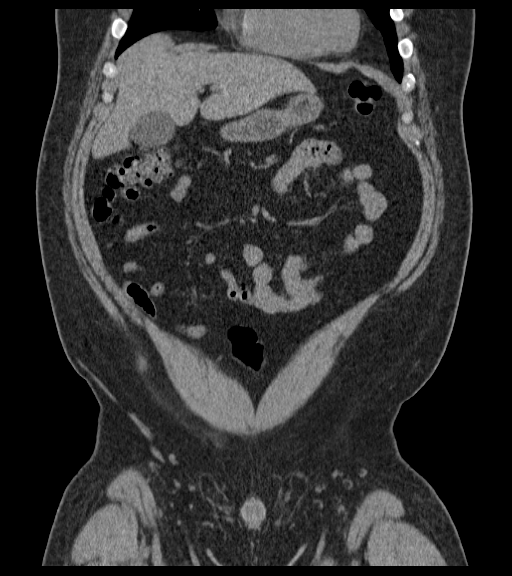
[im 78/175  soft-tissue]
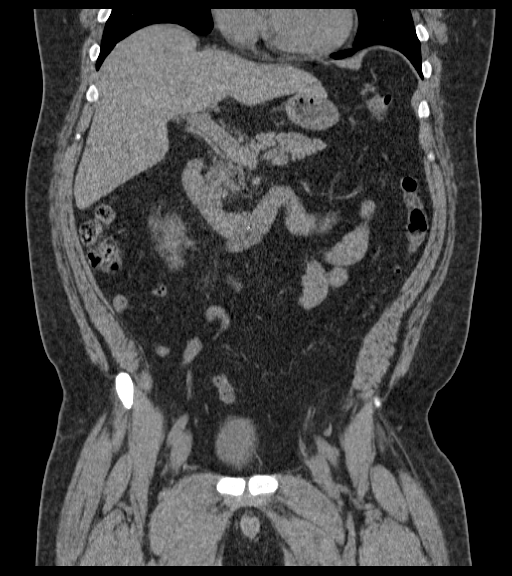
[im 97/175  soft-tissue]
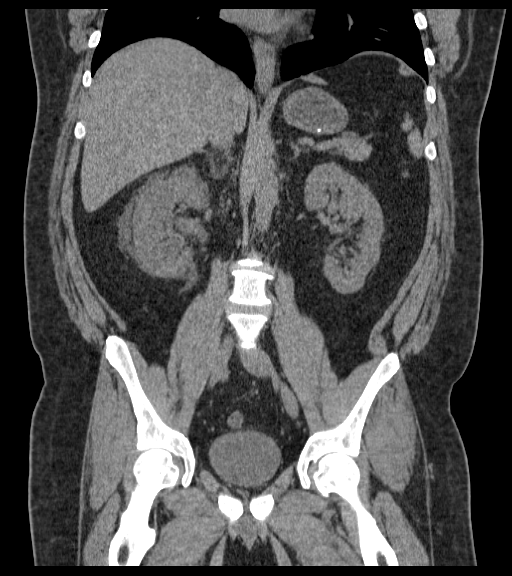

[16 of 46 positions shown; findings below may reference images not displayed]

FINDINGS: Lower chest:  Unremarkable

Hepatobiliary: Unremarkable

Pancreas: Unremarkable

Spleen: Unremarkable

Adrenals/Urinary Tract: Extensive right perirenal stranding with
moderate right hydronephrosis and moderate right hydroureter
extending down to a 9 by 5 by 6 mm mid right ureteral calculus at
the level of the iliac crests. The right ureter distal to this
calculus is not dilated. Urinary bladder unremarkable.

There is also a 4 mm right kidney lower pole nonobstructive
calculus. No left-sided renal calculi observed. Partially duplicated
left collecting system.

Stomach/Bowel: Unremarkable

Vascular/Lymphatic: Unremarkable

Reproductive: Unremarkable

Other: No supplemental non-categorized findings.

Musculoskeletal: Mild degenerative arthropathy of both hips, left
greater than right. Mild lumbar and thoracic spondylosis.
IMPRESSION: 1. Obstructive 9 by 5 by 6 mm right mid ureteral calculus at the
level of the iliac crests, associated with moderate hydronephrosis
and extensive right perirenal stranding. The extensive degree of
very renal stranding might indicate forniceal rupture. There is also
a 4 mm right kidney lower pole nonobstructive calculus.
2. Mild degenerative arthropathy of both hips, left greater than
right.

## 2016-10-14 ENCOUNTER — Other Ambulatory Visit: Payer: Self-pay | Admitting: Cardiovascular Disease

## 2016-12-01 ENCOUNTER — Other Ambulatory Visit: Payer: Self-pay | Admitting: Cardiovascular Disease

## 2016-12-04 ENCOUNTER — Telehealth: Payer: Self-pay

## 2016-12-04 NOTE — Telephone Encounter (Signed)
-----   Message from Margrett RudBrittany N Slayton, [place] sent at 12/04/2016  7:55 AM EDT ----- Will you try to schedule a follow up appointment with Dr. Mariah MillingGollan please.

## 2016-12-04 NOTE — Telephone Encounter (Signed)
LMOM to call and schedule f/u appt 

## 2016-12-06 ENCOUNTER — Other Ambulatory Visit: Payer: Self-pay | Admitting: *Deleted

## 2016-12-06 ENCOUNTER — Telehealth: Payer: Self-pay | Admitting: Cardiovascular Disease

## 2016-12-06 MED ORDER — METOPROLOL TARTRATE 25 MG PO TABS: ORAL_TABLET | ORAL | 0 refills | Status: DC

## 2016-12-06 NOTE — Telephone Encounter (Signed)
Requested Prescriptions   Signed Prescriptions Disp Refills  . metoprolol tartrate (LOPRESSOR) 25 MG tablet 60 tablet 0    Sig: TAKE 1 TABLET (25 MG TOTAL) BY MOUTH 2 (TWO) TIMES DAILY.    Authorizing Provider: Antonieta IbaGOLLAN, TIMOTHY J    Ordering User: Kendrick FriesLOPEZ, Cleotilde Spadaccini C

## 2016-12-06 NOTE — Telephone Encounter (Signed)
°*  STAT* If patient is at the pharmacy, call can be transferred to refill team.   1. Which medications need to be refilled? (please list name of each medication and dose if known) metoprolol 25 mg po BID   2. Which pharmacy/location (including street and city if local pharmacy) is medication to be sent to? [facility]   3. Do they need a 30 day or 90 day supply? 90

## 2016-12-06 NOTE — Telephone Encounter (Signed)
lmov for patient to call back and schedule f/u appt

## 2016-12-06 NOTE — Telephone Encounter (Signed)
Requested Prescriptions   Signed Prescriptions Disp Refills  . metoprolol tartrate (LOPRESSOR) 25 MG tablet 60 tablet 0    Sig: TAKE 1 TABLET (25 MG TOTAL) BY MOUTH 2 (TWO) TIMES DAILY.    Authorizing Provider: GOLLAN, TIMOTHY J    Ordering User: Ander Wamser C    

## 2016-12-10 NOTE — Telephone Encounter (Signed)
Pt is scheduled on 01/11/17

## 2017-01-07 ENCOUNTER — Encounter: Payer: Self-pay | Admitting: Cardiovascular Disease

## 2017-01-07 ENCOUNTER — Ambulatory Visit (INDEPENDENT_AMBULATORY_CARE_PROVIDER_SITE_OTHER): Payer: BLUE CROSS/BLUE SHIELD | Admitting: Cardiovascular Disease

## 2017-01-07 VITALS — BP 122/80 | HR 77 | Ht 71.0 in | Wt 293.8 lb

## 2017-01-07 DIAGNOSIS — E785 Hyperlipidemia, unspecified: Secondary | ICD-10-CM | POA: Diagnosis not present

## 2017-01-07 DIAGNOSIS — I10 Essential (primary) hypertension: Secondary | ICD-10-CM | POA: Diagnosis not present

## 2017-01-07 DIAGNOSIS — R0602 Shortness of breath: Secondary | ICD-10-CM | POA: Diagnosis not present

## 2017-01-07 DIAGNOSIS — R000 Tachycardia, unspecified: Secondary | ICD-10-CM | POA: Diagnosis not present

## 2017-01-07 MED ORDER — METOPROLOL TARTRATE 25 MG PO TABS: ORAL_TABLET | ORAL | 6 refills | Status: DC

## 2017-01-07 NOTE — Progress Notes (Signed)
Cardiology Office Note  Date:  01/07/2017   ID:  Kirk MarrowDavid W Smith, DOB 08/06/1975, MRN [id number]  PCP:  System, Pcp Not In   Chief Complaint  Patient presents with  . other    12 month follow up. Patient deneis chest pain and SOB. Meds reviewed verbally with patient.     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,  hypertension,  hyperlipidemia  remote history of tachycardia and palpitations  strong family history of coronary artery disease  who presents for routine followup of his hyperlipidemia and hypertension  Significant stressors recently, Divorced, sold house, not sleeping well Now Exercising , down 50 pounds Trying to eat better, protein shakes Working out at [employer] with no symptoms of chest pain or shortness of breath, 1 rare episode of short-lived tachycardia   lab work has been done through the city, not available today not diabetic a fasting glucose not available, he is a nonsmoker  History of meniscus tear right knee,  rotator cuff tear on the right  Feels he will need surgery  EKG on today's visit shows normal sinus rhythm with rate 77 bpm, no significant ST or T-wave changes   Other past medical history  Previous echocardiogram was essentially normal.   PMH:   has a past medical history of Bulging lumbar disc; CAD (coronary artery disease); Dysrhythmia; HLD (hyperlipidemia); HTN (hypertension); Kidney stone; Kidney stone; and Tachycardia.  PSH:    Past Surgical History:  Procedure Laterality Date  . CARPAL TUNNEL RELEASE    . CARPAL TUNNEL RELEASE Left   . KNEE ARTHROSCOPY Right 09/08/2015   Procedure: ARTHROSCOPY KNEE, partial lateral menisectomy, removal plica, partial synovectomy.;  Surgeon: Juanell FairlyKevin Krasinski, MD;  Location: [facility];  Service: Orthopedics;  Laterality: Right;  . LITHOTRIPSY    . TONSILLECTOMY      Current Outpatient Prescriptions  Medication Sig Dispense Refill  . atorvastatin  (LIPITOR) 40 MG tablet TAKE 1 TABLET (40 MG TOTAL) BY MOUTH DAILY. 90 tablet 3  . meloxicam (MOBIC) 15 MG tablet Take 15 mg by mouth daily. Weaning off    . methocarbamol (ROBAXIN) 750 MG tablet Take 375-750 mg by mouth 3 (three) times daily as needed for muscle spasms.    . metoprolol tartrate (LOPRESSOR) 25 MG tablet TAKE 1 TABLET (25 MG TOTAL) BY MOUTH 2 (TWO) TIMES DAILY. 60 tablet 0  . Omega-3 Fatty Acids (FISH OIL) 1000 MG CAPS Take 1 capsule by mouth daily.     No current facility-administered medications for this visit.      Allergies:   Penicillins; Phenergan [promethazine hcl]; and Promethazine   Social History:  The patient  reports that he has never smoked. His smokeless tobacco use includes Snuff. He reports that he drinks about 0.5 oz of alcohol per week . He reports that he does not use drugs.   Family History:   family history includes Heart disease in his brother; Heart failure in his father.    Review of Systems: Review of Systems  Constitutional: Negative.   Respiratory: Negative.   Cardiovascular: Negative.   Gastrointestinal: Negative.   Musculoskeletal: Positive for joint pain.  Neurological: Negative.   Psychiatric/Behavioral: Negative.   All other systems reviewed and are negative.    PHYSICAL EXAM: VS:  BP 122/80 (BP Location: Left Arm, Patient Position: Sitting, Cuff Size: Large)   Pulse 77   Ht 5\' 11"  (1.803 m)   Wt 293 lb  12 oz (133.2 kg)   BMI 40.97 kg/m  , BMI Body mass index is 40.97 kg/m. GEN: Well nourished, well developed, in no acute distress, obese  HEENT: normal  Neck: no JVD, carotid bruits, or masses Cardiac: RRR; no murmurs, rubs, or gallops,no edema  Respiratory:  clear to auscultation bilaterally, normal work of breathing GI: soft, nontender, nondistended, + BS MS: no deformity or atrophy  Skin: warm and dry, no rash Neuro:  Strength and sensation are intact Psych: euthymic mood, full affect    Recent Labs: No results found  for requested labs within last 8760 hours.    Lipid Panel No results found for: CHOL, HDL, LDLCALC, TRIG    Wt Readings from Last 3 Encounters:  01/07/17 293 lb 12 oz (133.2 kg)  10/10/15 (!) 336 lb 8 oz (152.6 kg)  09/08/15 (!) 320 lb (145.2 kg)       ASSESSMENT AND PLAN:  HYPERTENSION, BENIGN - Plan: EKG 12-Lead Blood pressure is well controlled on today's visit.  Will likely improve with further weight loss. Suspect he'll be able to wean down or off the metoprolol  Hyperlipidemia, unspecified hyperlipidemia type - Plan: EKG 12-Lead We'll try to obtain cholesterol from [place] lab work  Shortness of breath - Plan: EKG 12-Lead Breathing better with exercise and weight loss No further workup needed congratulated him on 50 pound weight loss  Tachycardia - Plan: EKG 12-Lead Having no symptoms, recommended he try to wean down on the metoprolol one half pill twice a day. If no symptoms and blood pressure stable could stop the metoprolol   Total encounter time more than 15 minutes  Greater than 50% was spent in counseling and coordination of care with the patient    Disposition:   F/U  12 months  Orders Placed This Encounter  Procedures  . EKG 12-Lead     Signed, Dossie Arbour, M.D., Ph.D. 01/07/2017  [facility] [place], [place] [phone]

## 2017-01-07 NOTE — Patient Instructions (Signed)
Medication Instructions:   Ok to play with metoprolol, wean down or off if blood pressure tolerates Goal <140 on the top, <90 on the bottom  Labwork:  No new labs needed  Testing/Procedures:  No further testing at this time   Follow-Up: It was a pleasure seeing you in the office today. Please call us if you have new issues that need to be addressed before your next appt.  [phone]  Your physician wants you to follow-up in: 12 months.  You will receive a reminder letter in the mail two months in advance. If you don't receive a letter, please call our office to schedule the follow-up appointment.  If you need a refill on your cardiac medications before your next appointment, please call your pharmacy.

## 2017-01-11 ENCOUNTER — Ambulatory Visit: Payer: BLUE CROSS/BLUE SHIELD | Admitting: Cardiovascular Disease

## 2017-03-06 DIAGNOSIS — H5213 Myopia, bilateral: Secondary | ICD-10-CM | POA: Diagnosis not present

## 2017-03-20 DIAGNOSIS — M65321 Trigger finger, right index finger: Secondary | ICD-10-CM | POA: Diagnosis not present

## 2017-03-20 DIAGNOSIS — M6530 Trigger finger, unspecified finger: Secondary | ICD-10-CM | POA: Insufficient documentation

## 2017-07-09 DIAGNOSIS — K635 Polyp of colon: Secondary | ICD-10-CM

## 2017-07-09 HISTORY — DX: Polyp of colon: K63.5

## 2017-08-20 ENCOUNTER — Other Ambulatory Visit: Payer: Self-pay | Admitting: *Deleted

## 2017-08-20 DIAGNOSIS — M65332 Trigger finger, left middle finger: Secondary | ICD-10-CM | POA: Diagnosis not present

## 2017-08-20 DIAGNOSIS — M65331 Trigger finger, right middle finger: Secondary | ICD-10-CM | POA: Diagnosis not present

## 2017-08-20 DIAGNOSIS — M65311 Trigger thumb, right thumb: Secondary | ICD-10-CM | POA: Diagnosis not present

## 2017-08-20 MED ORDER — METOPROLOL TARTRATE 25 MG PO TABS: ORAL_TABLET | ORAL | 2 refills | Status: DC

## 2017-09-03 DIAGNOSIS — M65311 Trigger thumb, right thumb: Secondary | ICD-10-CM | POA: Diagnosis not present

## 2017-09-03 DIAGNOSIS — M65321 Trigger finger, right index finger: Secondary | ICD-10-CM | POA: Diagnosis not present

## 2017-10-30 ENCOUNTER — Ambulatory Visit
Admission: RE | Admit: 2017-10-30 | Discharge: 2017-10-30 | Disposition: A | Discharge status: Home / Self Care | Payer: BLUE CROSS/BLUE SHIELD | Source: Ambulatory Visit | Attending: Family Medicine | Admitting: Family Medicine

## 2017-10-30 ENCOUNTER — Other Ambulatory Visit: Payer: Self-pay | Admitting: Family Medicine

## 2017-10-30 DIAGNOSIS — M7989 Other specified soft tissue disorders: Secondary | ICD-10-CM | POA: Insufficient documentation

## 2017-10-30 DIAGNOSIS — R52 Pain, unspecified: Secondary | ICD-10-CM

## 2017-10-30 DIAGNOSIS — S99921A Unspecified injury of right foot, initial encounter: Secondary | ICD-10-CM | POA: Diagnosis not present

## 2017-10-30 DIAGNOSIS — M7731 Calcaneal spur, right foot: Secondary | ICD-10-CM | POA: Insufficient documentation

## 2017-10-30 DIAGNOSIS — M79671 Pain in right foot: Secondary | ICD-10-CM | POA: Diagnosis not present

## 2017-12-16 DIAGNOSIS — Z1211 Encounter for screening for malignant neoplasm of colon: Secondary | ICD-10-CM | POA: Diagnosis not present

## 2017-12-16 DIAGNOSIS — Z8371 Family history of colonic polyps: Secondary | ICD-10-CM | POA: Diagnosis not present

## 2017-12-16 DIAGNOSIS — Z01818 Encounter for other preprocedural examination: Secondary | ICD-10-CM | POA: Diagnosis not present

## 2017-12-17 DIAGNOSIS — M722 Plantar fascial fibromatosis: Secondary | ICD-10-CM | POA: Diagnosis not present

## 2017-12-21 ENCOUNTER — Other Ambulatory Visit: Payer: Self-pay | Admitting: Cardiovascular Disease

## 2018-01-07 DIAGNOSIS — M722 Plantar fascial fibromatosis: Secondary | ICD-10-CM | POA: Diagnosis not present

## 2018-02-06 DIAGNOSIS — D126 Benign neoplasm of colon, unspecified: Secondary | ICD-10-CM | POA: Diagnosis not present

## 2018-02-06 DIAGNOSIS — Z1211 Encounter for screening for malignant neoplasm of colon: Secondary | ICD-10-CM | POA: Diagnosis not present

## 2018-02-06 DIAGNOSIS — K640 First degree hemorrhoids: Secondary | ICD-10-CM | POA: Diagnosis not present

## 2018-02-06 DIAGNOSIS — Z8371 Family history of colonic polyps: Secondary | ICD-10-CM | POA: Diagnosis not present

## 2018-02-06 DIAGNOSIS — D123 Benign neoplasm of transverse colon: Secondary | ICD-10-CM | POA: Diagnosis not present

## 2018-02-06 DIAGNOSIS — K648 Other hemorrhoids: Secondary | ICD-10-CM | POA: Diagnosis not present

## 2018-03-25 DIAGNOSIS — M65311 Trigger thumb, right thumb: Secondary | ICD-10-CM | POA: Diagnosis not present

## 2018-03-25 DIAGNOSIS — M65321 Trigger finger, right index finger: Secondary | ICD-10-CM | POA: Diagnosis not present

## 2018-03-28 ENCOUNTER — Other Ambulatory Visit: Payer: Self-pay | Admitting: Cardiovascular Disease

## 2018-04-02 ENCOUNTER — Other Ambulatory Visit: Payer: Self-pay | Admitting: Orthopaedic Surgery

## 2018-04-02 DIAGNOSIS — M65331 Trigger finger, right middle finger: Secondary | ICD-10-CM

## 2018-04-03 ENCOUNTER — Other Ambulatory Visit: Payer: Self-pay | Admitting: Orthopaedic Surgery

## 2018-04-03 ENCOUNTER — Ambulatory Visit
Admission: RE | Admit: 2018-04-03 | Discharge: 2018-04-03 | Disposition: A | Discharge status: Home / Self Care | Payer: BLUE CROSS/BLUE SHIELD | Source: Ambulatory Visit | Attending: Orthopaedic Surgery | Admitting: Orthopaedic Surgery

## 2018-04-03 DIAGNOSIS — M65841 Other synovitis and tenosynovitis, right hand: Secondary | ICD-10-CM | POA: Diagnosis not present

## 2018-04-03 DIAGNOSIS — M65331 Trigger finger, right middle finger: Secondary | ICD-10-CM

## 2018-05-01 ENCOUNTER — Other Ambulatory Visit: Payer: Self-pay | Admitting: Cardiovascular Disease

## 2018-05-05 ENCOUNTER — Other Ambulatory Visit: Payer: Self-pay | Admitting: Cardiovascular Disease

## 2018-05-06 DIAGNOSIS — M25561 Pain in right knee: Secondary | ICD-10-CM | POA: Diagnosis not present

## 2018-05-26 ENCOUNTER — Telehealth: Payer: Self-pay | Admitting: Cardiovascular Disease

## 2018-05-26 ENCOUNTER — Other Ambulatory Visit: Payer: Self-pay | Admitting: Cardiovascular Disease

## 2018-05-26 NOTE — Telephone Encounter (Signed)
Attempted to schedule ov with Gollan.  No ans no vm

## 2018-05-26 NOTE — Telephone Encounter (Signed)
-----   Message from Festus AloeSharon G Crespo, CMA sent at 05/26/2018 12:41 PM EST ----- Please contact patient for a follow up. Patient was to be seen in July 2019.  Thanks, Jasmine DecemberSharon

## 2018-05-26 NOTE — Telephone Encounter (Signed)
Patient calling back .  He was told by Dr. Mariah MillingGollan unless he was having issues he could be seen every 2 years .  Patient needs refills .

## 2018-05-27 NOTE — Telephone Encounter (Signed)
Please review for refills pt last seen 7/2 2018. Please review previous notes.

## 2018-05-28 NOTE — Telephone Encounter (Signed)
Patient saw Dr Mariah MillingGollan on 01/2017 and [facility]S says follow up in 12 months. Patient needs to be seen at least once a year for ongoing refills. Patient needs appointment. If he needs us to send in enough pills to get him to his scheduled appointment, we can do this. Please let us know. Thanks!

## 2018-05-30 NOTE — Telephone Encounter (Signed)
S/w patient. He was very understanding of reason for needing to come in to see Dr Mariah MillingGollan every year in order to receive prescription refills. He says last night he saw Dr Mariah MillingGollan, he said patient could come in every 2 years instead. Patient is wanting to see if his PCP will refill prescriptions for now and see him in January. Patient has enough medications to get him to that appointment. In July 2020, it will be 2 years since seeing Dr Mariah MillingGollan so patient will call back closer to that time to schedule an appointment to see him. He was very appreciative and will let us know if any further questions or concerns arise.

## 2018-05-31 NOTE — Telephone Encounter (Signed)
If we can get a updated liver and lipids , fasting (maybe PMD has one?) We can renew the lipitor script  If he has a BP and heart rate,we can renew the metoprolol Then see him later in 2020, should be fine

## 2018-06-04 NOTE — Telephone Encounter (Signed)
Returned the call to the patient. He stated that the Metoprolol and Atorvastatin had already been refilled by this office. He will be getting labs done within the next month for his job and will have them faxed.

## 2018-06-08 NOTE — Telephone Encounter (Signed)
appt every 2  Years should be fine,  Will review labs when they arrive  thx TG

## 2018-08-03 ENCOUNTER — Other Ambulatory Visit: Payer: Self-pay | Admitting: Cardiovascular Disease

## 2018-08-05 ENCOUNTER — Other Ambulatory Visit: Payer: Self-pay | Admitting: Cardiovascular Disease

## 2018-08-28 ENCOUNTER — Other Ambulatory Visit: Payer: Self-pay | Admitting: Cardiovascular Disease

## 2018-08-29 NOTE — Telephone Encounter (Signed)
Please advise reference a refill until patient can be seen.  Spoke with patient he was understanding to decrease his cholesterol medications instead of the metoprolol.  He is currently taking Metoprolol one tablet twice a day and wishes to continue until he can be seen. He is due for blood work through his work. He will call to schedule an appointment with our office and will bring his lab work with him to that appointment.

## 2018-08-29 NOTE — Telephone Encounter (Signed)
Please review for refill. Last visit 01/2017.

## 2018-08-29 NOTE — Telephone Encounter (Signed)
Need to call and confirm with the patient how he is taking this. Per Dr. Windell Hummingbird last office note on 01/07/17: Tachycardia - Plan: EKG 12-Lead Having no symptoms, recommended he try to wean down on the metoprolol one half pill twice a day. If no symptoms and blood pressure stable could stop the metoprolol   The patient is overdue for follow up. Please call to confirm how he is taking this/ if he still is. Just let me know how he is taking this. We can maybe refill for a 1 month supply but he will need to make a follow up appointment.  Thanks!

## 2018-09-03 ENCOUNTER — Other Ambulatory Visit: Payer: Self-pay | Admitting: Cardiovascular Disease

## 2018-09-10 ENCOUNTER — Ambulatory Visit (INDEPENDENT_AMBULATORY_CARE_PROVIDER_SITE_OTHER): Payer: BLUE CROSS/BLUE SHIELD | Admitting: Nurse Practitioner

## 2018-09-10 ENCOUNTER — Encounter: Payer: Self-pay | Admitting: Nurse Practitioner

## 2018-09-10 VITALS — BP 130/90 | HR 81 | Ht 71.0 in | Wt 323.5 lb

## 2018-09-10 DIAGNOSIS — I10 Essential (primary) hypertension: Secondary | ICD-10-CM

## 2018-09-10 MED ORDER — METOPROLOL TARTRATE 25 MG PO TABS: 25.0000 mg | ORAL_TABLET | Freq: Two times a day (BID) | ORAL | 11 refills | Status: DC

## 2018-09-10 NOTE — Progress Notes (Signed)
Office Visit    Patient Name: Kirk Smith Date of Encounter: 09/10/2018  Primary Care Provider:  System, Pcp Not In Primary Cardiologist:  Julien Nordmann, MD  Chief Complaint    43 year old male with a history of hypertension, hyperlipidemia, obesity, palpitations, diastolic dysfunction, nephrolithiasis, and back pain, who presents for follow-up related to hypertension.  Past Medical History    Past Medical History:  Diagnosis Date  . Bulging lumbar disc   . CAD (coronary artery disease)   . Diastolic dysfunction    a. 09/2010 Echo: EF 55-60%, no rwma, mild LVH, gr1 DD. Mildly dil LA.  Marland Kitchen HLD (hyperlipidemia)   . HTN (hypertension)   . Kidney stone   . Morbid obesity (HCC)   . Palpitations    a. H/o tachycardia.   Past Surgical History:  Procedure Laterality Date  . CARPAL TUNNEL RELEASE    . CARPAL TUNNEL RELEASE Left   . KNEE ARTHROSCOPY Right 09/08/2015   Procedure: ARTHROSCOPY KNEE, partial lateral menisectomy, removal plica, partial synovectomy.;  Surgeon: Juanell Fairly, MD;  Location: [facility] ORS;  Service: Orthopedics;  Laterality: Right;  . LITHOTRIPSY    . TONSILLECTOMY      Allergies  Allergies  Allergen Reactions  . Penicillins Other (See Comments)    Reaction: Pt states this was as a child. Pt recently took Penicillin and had no reaction.  Betsey Amen [Promethazine Hcl] Nausea And Vomiting  . Promethazine Nausea And Vomiting    History of Present Illness    43 year old male with the above past medical history including hypertension, hyperlipidemia, obesity, palpitations, diastolic dysfunction, nephrolithiasis, and back pain.  He was last seen in clinic in July 2018.  Since then, he has done well from a cardiac standpoint.  He checks his blood pressure about 4 times a week and notes it is typically in the 120/80 range with a heart rate in the 70s to 80s.  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  He has gained weight since his last visit, which he attributes to being somewhat stretched between work and taking care of his 3 children following what he describes as a messy divorce.  He is eating out more than he would like to.  He denies palpitations, PND, orthopnea, dizziness, syncope, edema, or early satiety.  Home Medications    Prior to Admission medications   Medication Sig Start Date End Date Taking? Authorizing Provider  atorvastatin (LIPITOR) 40 MG tablet TAKE 1 TABLET BY MOUTH EVERY DAY 08/06/18  Yes Gollan, Tollie Pizza, MD  meloxicam (MOBIC) 15 MG tablet Take 15 mg by mouth daily. Weaning off   Yes [provider]  methocarbamol (ROBAXIN) 750 MG tablet Take 375-750 mg by mouth 3 (three) times daily as needed for muscle spasms.   Yes [provider]  metoprolol tartrate (LOPRESSOR) 25 MG tablet TAKE 1 TABLET BY MOUTH TWICE A DAY 08/29/18  Yes Gollan, Tollie Pizza, MD  Omega-3 Fatty Acids (FISH OIL) 1000 MG CAPS Take 1 capsule by mouth daily.   Yes [provider]    Review of Systems    As above doing well.  He denies chest pain, palpitations, dyspnea, pnd, orthopnea, n, v, dizziness, syncope, edema, weight gain, or early satiety.  All other systems reviewed and are otherwise negative except as noted above.  Physical Exam  VS:  BP 130/90 (BP Location: Left Arm, Patient Position: Sitting, Cuff Size: Normal)   Pulse 81   Ht 5\' 11"  (1.803 m)   Wt (!) 323 lb 8 oz (146.7 kg)   BMI 45.12 kg/m  , BMI Body mass index is 45.12 kg/m. GEN: Obese, in no acute distress. HEENT: normal. Neck: Supple, obese, difficult to gauge JVP.  No carotid bruits, or masses. Cardiac: RRR, distant heart sounds, no murmurs, rubs, or gallops. No clubbing, cyanosis, edema.  Radials/DP/PT 2+ and equal bilaterally.  Respiratory:  Respirations regular and unlabored, clear to auscultation bilaterally. GI:  Soft, nontender, nondistended, BS + x 4. MS: no deformity or atrophy. Skin: warm and dry, no rash. Neuro:  Strength and sensation are intact. Psych: Normal affect.  Accessory Clinical Findings    ECG personally reviewed by me today -regular sinus rhythm, 81- no acute changes.  Assessment & Plan    1.  Essential hypertension: Blood pressure mildly elevated today at 130/90.  He does check his blood pressure 3-4 times per week at home and says he typically runs 120/80.  I will refill his metoprolol 25 mg twice daily.  I did encourage him to continue to follow blood pressure closely and if he is noticing numbers similar to what he is seen today, he should contact us or his primary care provider, at which point we can escalate metoprolol 50 mg twice daily.  We also discussed the importance of trying avoid fast and prepackaged/processed foods as those are much more likely to be high in salt.  2.  Hyperlipidemia: This is been followed by primary care.  He says his numbers look pretty good last year and he is due for repeat labs soon.  Remains on Lipitor.  3.  Morbid obesity: He has gained roughly 30 pounds since his visit in 2018.  He attributes this to being more stress between work and taking care of his children and eating out more frequently.  He recognizes he needs to cut his calories and increase his activity.  4.  Disposition: We will plan to have his primary care provider refill his metoprolol in the future.  Follow-up PRN.  Nicolasa Ducking, NP 09/10/2018, 1:37 PM

## 2018-09-10 NOTE — Patient Instructions (Signed)
Medication Instructions:  Your physician recommends that you continue on your current medications as directed. Please refer to the Current Medication list given to you today.  If you need a refill on your cardiac medications before your next appointment, please call your pharmacy.   Lab work: None ordered  If you have labs (blood work) drawn today and your tests are completely normal, you will receive your results only by: . MyChart Message (if you have MyChart) OR . A paper copy in the mail If you have any lab test that is abnormal or we need to change your treatment, we will call you to review the results.  Testing/Procedures: None ordered   Follow-Up: At [facility], you and your health needs are our priority.  As part of our continuing mission to provide you with exceptional heart care, we have created designated Provider Care Teams.  These Care Teams include your primary Cardiologist (physician) and Advanced Practice Providers (APPs -  Physician Assistants and Nurse Practitioners) who all work together to provide you with the care you need, when you need it. You will need a follow up appointment as needed.  

## 2018-09-25 DIAGNOSIS — M25641 Stiffness of right hand, not elsewhere classified: Secondary | ICD-10-CM | POA: Insufficient documentation

## 2018-09-25 DIAGNOSIS — M25642 Stiffness of left hand, not elsewhere classified: Secondary | ICD-10-CM | POA: Diagnosis not present

## 2018-09-25 DIAGNOSIS — M25442 Effusion, left hand: Secondary | ICD-10-CM | POA: Insufficient documentation

## 2018-09-25 DIAGNOSIS — Z840 Family history of diseases of the skin and subcutaneous tissue: Secondary | ICD-10-CM | POA: Diagnosis not present

## 2018-09-25 DIAGNOSIS — Z8269 Family history of other diseases of the musculoskeletal system and connective tissue: Secondary | ICD-10-CM | POA: Diagnosis not present

## 2018-10-08 DIAGNOSIS — M25442 Effusion, left hand: Secondary | ICD-10-CM | POA: Diagnosis not present

## 2018-10-08 DIAGNOSIS — Z840 Family history of diseases of the skin and subcutaneous tissue: Secondary | ICD-10-CM | POA: Diagnosis not present

## 2018-10-08 DIAGNOSIS — Z79899 Other long term (current) drug therapy: Secondary | ICD-10-CM | POA: Insufficient documentation

## 2018-10-08 DIAGNOSIS — M25641 Stiffness of right hand, not elsewhere classified: Secondary | ICD-10-CM | POA: Diagnosis not present

## 2018-12-10 DIAGNOSIS — M25561 Pain in right knee: Secondary | ICD-10-CM | POA: Diagnosis not present

## 2018-12-10 DIAGNOSIS — F17200 Nicotine dependence, unspecified, uncomplicated: Secondary | ICD-10-CM | POA: Diagnosis not present

## 2018-12-10 DIAGNOSIS — I10 Essential (primary) hypertension: Secondary | ICD-10-CM | POA: Diagnosis not present

## 2018-12-10 DIAGNOSIS — Z79899 Other long term (current) drug therapy: Secondary | ICD-10-CM | POA: Diagnosis not present

## 2018-12-10 DIAGNOSIS — S83281A Other tear of lateral meniscus, current injury, right knee, initial encounter: Secondary | ICD-10-CM | POA: Diagnosis not present

## 2018-12-15 DIAGNOSIS — M25561 Pain in right knee: Secondary | ICD-10-CM | POA: Diagnosis not present

## 2018-12-15 DIAGNOSIS — M948X6 Other specified disorders of cartilage, lower leg: Secondary | ICD-10-CM | POA: Diagnosis not present

## 2018-12-15 DIAGNOSIS — M25461 Effusion, right knee: Secondary | ICD-10-CM | POA: Diagnosis not present

## 2018-12-15 DIAGNOSIS — S83281A Other tear of lateral meniscus, current injury, right knee, initial encounter: Secondary | ICD-10-CM | POA: Diagnosis not present

## 2018-12-18 DIAGNOSIS — S83281D Other tear of lateral meniscus, current injury, right knee, subsequent encounter: Secondary | ICD-10-CM | POA: Diagnosis not present

## 2018-12-18 DIAGNOSIS — S83281A Other tear of lateral meniscus, current injury, right knee, initial encounter: Secondary | ICD-10-CM | POA: Insufficient documentation

## 2018-12-22 DIAGNOSIS — Z20828 Contact with and (suspected) exposure to other viral communicable diseases: Secondary | ICD-10-CM | POA: Diagnosis not present

## 2019-01-06 DIAGNOSIS — Z95810 Presence of automatic (implantable) cardiac defibrillator: Secondary | ICD-10-CM | POA: Diagnosis not present

## 2019-01-06 DIAGNOSIS — M1990 Unspecified osteoarthritis, unspecified site: Secondary | ICD-10-CM | POA: Diagnosis not present

## 2019-01-06 DIAGNOSIS — S83281D Other tear of lateral meniscus, current injury, right knee, subsequent encounter: Secondary | ICD-10-CM | POA: Diagnosis not present

## 2019-01-06 DIAGNOSIS — E669 Obesity, unspecified: Secondary | ICD-10-CM | POA: Diagnosis not present

## 2019-01-06 DIAGNOSIS — I10 Essential (primary) hypertension: Secondary | ICD-10-CM | POA: Diagnosis not present

## 2019-01-06 DIAGNOSIS — Z9641 Presence of insulin pump (external) (internal): Secondary | ICD-10-CM | POA: Diagnosis not present

## 2019-01-06 DIAGNOSIS — M069 Rheumatoid arthritis, unspecified: Secondary | ICD-10-CM | POA: Diagnosis not present

## 2019-01-06 DIAGNOSIS — Z880 Allergy status to penicillin: Secondary | ICD-10-CM | POA: Diagnosis not present

## 2019-01-06 DIAGNOSIS — S4991XA Unspecified injury of right shoulder and upper arm, initial encounter: Secondary | ICD-10-CM | POA: Diagnosis not present

## 2019-01-06 DIAGNOSIS — Z791 Long term (current) use of non-steroidal anti-inflammatories (NSAID): Secondary | ICD-10-CM | POA: Diagnosis not present

## 2019-01-06 DIAGNOSIS — M23000 Cystic meniscus, unspecified lateral meniscus, right knee: Secondary | ICD-10-CM | POA: Diagnosis not present

## 2019-01-06 DIAGNOSIS — E785 Hyperlipidemia, unspecified: Secondary | ICD-10-CM | POA: Diagnosis not present

## 2019-01-06 DIAGNOSIS — S8991XA Unspecified injury of right lower leg, initial encounter: Secondary | ICD-10-CM | POA: Diagnosis not present

## 2019-01-06 DIAGNOSIS — S83281A Other tear of lateral meniscus, current injury, right knee, initial encounter: Secondary | ICD-10-CM | POA: Diagnosis not present

## 2019-01-06 DIAGNOSIS — K219 Gastro-esophageal reflux disease without esophagitis: Secondary | ICD-10-CM | POA: Diagnosis not present

## 2019-01-22 ENCOUNTER — Telehealth: Payer: Self-pay

## 2019-01-22 ENCOUNTER — Ambulatory Visit: Payer: Self-pay | Admitting: Internal Medicine

## 2019-01-22 ENCOUNTER — Other Ambulatory Visit: Payer: Self-pay

## 2019-01-22 ENCOUNTER — Encounter: Payer: Self-pay | Admitting: Internal Medicine

## 2019-01-22 VITALS — BP 149/89 | HR 82 | Temp 97.3°F | Ht 71.0 in | Wt 326.0 lb

## 2019-01-22 DIAGNOSIS — S83281A Other tear of lateral meniscus, current injury, right knee, initial encounter: Secondary | ICD-10-CM | POA: Diagnosis not present

## 2019-01-22 DIAGNOSIS — E7849 Other hyperlipidemia: Secondary | ICD-10-CM

## 2019-01-22 DIAGNOSIS — I10 Essential (primary) hypertension: Secondary | ICD-10-CM

## 2019-01-22 DIAGNOSIS — Z9889 Other specified postprocedural states: Secondary | ICD-10-CM

## 2019-01-22 DIAGNOSIS — N529 Male erectile dysfunction, unspecified: Secondary | ICD-10-CM | POA: Insufficient documentation

## 2019-01-22 MED ORDER — SILDENAFIL CITRATE 20 MG PO TABS: ORAL_TABLET | ORAL | 5 refills | Status: DC

## 2019-01-22 NOTE — Progress Notes (Signed)
Patient is a 43 y.o. male with h/o morbid obesity, HTN, hyperlipidemia who presents after having surgery 6/30 on his right knee, arthroscopic and in a straight leg immobillizer presently. He is on transitional duty with the PD here per [facility] and was told to come here to see us as well. He is getting around without crutches at present. He noted his pain \\had  been fairly well controlled until this morning turned funny and had severe pain with that which has improved some.    He also noted he was on ph\entermine in the past and it helped him lose weight, but not being active in the past with COvid issues and the knee issues, he has gained weight back and was asking to return to using phentermine to help him lose weight. He is trying a keto diet in the last two weeks and notes has not lost weight with this.   He also has ED sx's again recurring, occasionally gets erections, sometimes not, noted his wife and him trying at present. He was on sildenafil int he past when he had these sx's and was helpful.  He is on meds for HTN and hyperlipidemia and also has suffered from plantar fasciitis in fairly recent past limiting his exercise.  Allergies  Allergen Reactions  . Penicillins Other (See Comments)    Reaction: Pt states this was as a child. Pt recently took Penicillin and had no reaction.  Betsey Amen. Phenergan [Promethazine Hcl] Nausea And Vomiting  . Promethazine Nausea And Vomiting   Current Outpatient Medications on File Prior to Visit  Medication Sig Dispense Refill  . aspirin EC 81 MG tablet Take by mouth. Due to surgery    . atorvastatin (LIPITOR) 40 MG tablet TAKE 1 TABLET BY MOUTH EVERY DAY 30 tablet 0  . Coenzyme Q10 10 MG capsule Take by mouth.    . esomeprazole (NEXIUM) 20 MG capsule Take by mouth.    . meloxicam (MOBIC) 15 MG tablet Take 15 mg by mouth daily. Take half a tablet    . methocarbamol (ROBAXIN) 750 MG tablet Take 375-750 mg by mouth. Take one tablet prn     . metoprolol tartrate (LOPRESSOR) 25 MG tablet Take 1 tablet (25 mg total) by mouth 2 (two) times daily. 60 tablet 11  . Omega-3 Fatty Acids (FISH OIL) 1000 MG CAPS Take 1 capsule by mouth daily.    Marland Kitchen. sulfaSALAzine (AZULFIDINE) 500 MG EC tablet Take 2 tabs twice a day for 30 days , 120 tabs     No current facility-administered medications on file prior to visit.    Tob - never smoker, quit chew 08/2018  FH - + HD in F and B  O - NAD, masked, obese  BP (!) 149/89 (BP Location: Right Arm, Patient Position: Sitting, Cuff Size: Large)   Pulse 82   Temp (!) 97.3 F (36.3 C) (Oral)   Ht 5\' 11"  (1.803 m)   Wt (!) 326 lb (147.9 kg)   SpO2 97%   BMI 45.47 kg/m   Recheck BP - 144/86 on left Weight 11/2017 - 309 lbs.   HEENT - sclera anicteric Car - RRR without m/g/r Abd - obese Ext - right with knee immobilizer and ankle at and below where brace ends had 1-2+ edema, left ankle without edema Affect not flat, approp with conversation  A/P: 1. S/p right [facility] and PT starts later today for rehab  On transitional duty  at present and to continue and has f/u planned with ortho in August to re-assess. Contiue to get ortho input to help with RTW guidance.  2. Obesity  - has gained 17 lbs back since 11/2017.   The importance of non-pharmaceutical weight loss rec's emphasized when adding any med to help With the comorbidities noted, restarting medications entertained to help and of the weight loss medicines currently available and reviewed, phentermine likely to be the best choice presently.  Discussed that the FDA has approved use of this for 3 months of a year, with further use not recommended if not successful with weight loss over this time.  The risk/benefits of this medicine discussed and noted is a controlled substance and can increase heart rate and BP, and given his BP is borderline high today, felt best to not add today.  Noted pain could be  contributor to increase BP as well (as he noted usually has been about 120/80 in past)and agreed to get through the early part of PT, which can be painful and ROM back of LE in hopes of being able to do some exercise with diet and med to achieve successful weight loss. rec'ed exercise in the water as a good option.   3. HTN - had been better controlled, borderline high presently   As above, pain possible contributor, as is increased weight. Will f/u in 6-8 weeks and re-assess before add or increase BP meds and asked he get BP checked at PT visits to help follow.   4. Hyperlipidemia - higher TG's and low HDL noted on last labs 11/2017  5. ED - felt reasonable to again try the sildenafil product that worked prior, and rec;ed taking 2-3 of the 20 mg tabs prn prior to planned activities and assess response.   F/u in 6-8 weeks, sooner prn Follow-up monthly after starting to monitor.

## 2019-01-22 NOTE — Telephone Encounter (Signed)
Called in silden[facility]il to [facility] and cancelled rx to [facility] [address].

## 2019-01-26 DIAGNOSIS — S83281A Other tear of lateral meniscus, current injury, right knee, initial encounter: Secondary | ICD-10-CM | POA: Diagnosis not present

## 2019-01-30 DIAGNOSIS — S83281A Other tear of lateral meniscus, current injury, right knee, initial encounter: Secondary | ICD-10-CM | POA: Diagnosis not present

## 2019-02-09 DIAGNOSIS — S83281A Other tear of lateral meniscus, current injury, right knee, initial encounter: Secondary | ICD-10-CM | POA: Diagnosis not present

## 2019-02-12 DIAGNOSIS — S83281A Other tear of lateral meniscus, current injury, right knee, initial encounter: Secondary | ICD-10-CM | POA: Diagnosis not present

## 2019-02-16 DIAGNOSIS — S83281A Other tear of lateral meniscus, current injury, right knee, initial encounter: Secondary | ICD-10-CM | POA: Diagnosis not present

## 2019-02-25 DIAGNOSIS — S83281A Other tear of lateral meniscus, current injury, right knee, initial encounter: Secondary | ICD-10-CM | POA: Diagnosis not present

## 2019-03-03 DIAGNOSIS — S83281A Other tear of lateral meniscus, current injury, right knee, initial encounter: Secondary | ICD-10-CM | POA: Diagnosis not present

## 2019-03-10 DIAGNOSIS — S83281A Other tear of lateral meniscus, current injury, right knee, initial encounter: Secondary | ICD-10-CM | POA: Diagnosis not present

## 2019-03-17 DIAGNOSIS — S83281A Other tear of lateral meniscus, current injury, right knee, initial encounter: Secondary | ICD-10-CM | POA: Diagnosis not present

## 2019-03-26 ENCOUNTER — Ambulatory Visit: Payer: Self-pay | Admitting: Internal Medicine

## 2019-03-27 DIAGNOSIS — S83281A Other tear of lateral meniscus, current injury, right knee, initial encounter: Secondary | ICD-10-CM | POA: Diagnosis not present

## 2019-04-03 DIAGNOSIS — S83281A Other tear of lateral meniscus, current injury, right knee, initial encounter: Secondary | ICD-10-CM | POA: Diagnosis not present

## 2019-04-07 ENCOUNTER — Ambulatory Visit: Payer: Self-pay

## 2019-04-07 DIAGNOSIS — Z23 Encounter for immunization: Secondary | ICD-10-CM

## 2019-04-09 DIAGNOSIS — S83281A Other tear of lateral meniscus, current injury, right knee, initial encounter: Secondary | ICD-10-CM | POA: Diagnosis not present

## 2019-04-10 DIAGNOSIS — S83281D Other tear of lateral meniscus, current injury, right knee, subsequent encounter: Secondary | ICD-10-CM | POA: Diagnosis not present

## 2019-04-17 DIAGNOSIS — M722 Plantar fascial fibromatosis: Secondary | ICD-10-CM | POA: Diagnosis not present

## 2019-04-17 DIAGNOSIS — M79672 Pain in left foot: Secondary | ICD-10-CM | POA: Diagnosis not present

## 2019-04-17 DIAGNOSIS — M779 Enthesopathy, unspecified: Secondary | ICD-10-CM | POA: Diagnosis not present

## 2019-04-23 DIAGNOSIS — G8929 Other chronic pain: Secondary | ICD-10-CM | POA: Diagnosis not present

## 2019-04-23 DIAGNOSIS — M79672 Pain in left foot: Secondary | ICD-10-CM | POA: Diagnosis not present

## 2019-04-23 DIAGNOSIS — M722 Plantar fascial fibromatosis: Secondary | ICD-10-CM | POA: Diagnosis not present

## 2019-05-19 DIAGNOSIS — L814 Other melanin hyperpigmentation: Secondary | ICD-10-CM | POA: Diagnosis not present

## 2019-05-19 DIAGNOSIS — C44319 Basal cell carcinoma of skin of other parts of face: Secondary | ICD-10-CM | POA: Diagnosis not present

## 2019-05-19 DIAGNOSIS — D485 Neoplasm of uncertain behavior of skin: Secondary | ICD-10-CM | POA: Diagnosis not present

## 2019-05-19 DIAGNOSIS — D225 Melanocytic nevi of trunk: Secondary | ICD-10-CM | POA: Diagnosis not present

## 2019-06-09 ENCOUNTER — Other Ambulatory Visit: Payer: Self-pay | Admitting: *Deleted

## 2019-06-09 ENCOUNTER — Telehealth: Payer: Self-pay

## 2019-06-09 MED ORDER — METOPROLOL TARTRATE 25 MG PO TABS: 25.0000 mg | ORAL_TABLET | Freq: Two times a day (BID) | ORAL | 3 refills | Status: DC

## 2019-06-09 NOTE — Telephone Encounter (Signed)
Received Prior Authorization request from [facility]) for Esomeprazole Mag DR 20 mg capsules - 1 cap po qd.  Contacted [employer] at [phone] & spoke with Jeannetta Nap in Customer Service.  PA approved for 3 years starting today (06/08/1009 - 06/08/2022).  Pharmacy can process refills today.  Called [facility] [place]) at [phone] & left the prior authorization information on the pharmacy voice mail.  AMD

## 2019-06-11 ENCOUNTER — Other Ambulatory Visit: Payer: Self-pay | Admitting: *Deleted

## 2019-06-11 MED ORDER — METOPROLOL TARTRATE 25 MG PO TABS: 25.0000 mg | ORAL_TABLET | Freq: Two times a day (BID) | ORAL | 3 refills | Status: DC

## 2019-06-22 DIAGNOSIS — C44319 Basal cell carcinoma of skin of other parts of face: Secondary | ICD-10-CM | POA: Diagnosis not present

## 2019-06-22 DIAGNOSIS — Z85828 Personal history of other malignant neoplasm of skin: Secondary | ICD-10-CM | POA: Diagnosis not present

## 2019-07-09 ENCOUNTER — Ambulatory Visit: Payer: 59

## 2019-09-11 ENCOUNTER — Other Ambulatory Visit: Payer: Self-pay

## 2019-09-11 MED ORDER — ESOMEPRAZOLE MAGNESIUM 20 MG PO CPDR: 20.0000 mg | DELAYED_RELEASE_CAPSULE | Freq: Every day | ORAL | 0 refills | Status: DC

## 2019-09-16 ENCOUNTER — Encounter: Payer: Self-pay | Admitting: Physician Assistant

## 2019-09-16 ENCOUNTER — Ambulatory Visit: Payer: Self-pay | Admitting: Physician Assistant

## 2019-09-16 ENCOUNTER — Ambulatory Visit: Payer: Self-pay

## 2019-09-16 ENCOUNTER — Other Ambulatory Visit: Payer: Self-pay

## 2019-09-16 VITALS — BP 120/84 | HR 76 | Temp 97.3°F | Ht 70.0 in | Wt 328.0 lb

## 2019-09-16 DIAGNOSIS — F900 Attention-deficit hyperactivity disorder, predominantly inattentive type: Secondary | ICD-10-CM

## 2019-09-16 DIAGNOSIS — Z0000 Encounter for general adult medical examination without abnormal findings: Secondary | ICD-10-CM

## 2019-09-16 LAB — POCT URINALYSIS DIPSTICK
Bilirubin, UA: NEGATIVE
Blood, UA: NEGATIVE
Glucose, UA: NEGATIVE
Ketones, UA: NEGATIVE
Leukocytes, UA: NEGATIVE
Nitrite, UA: NEGATIVE
Protein, UA: NEGATIVE
Spec Grav, UA: 1.02 (ref 1.010–1.025)
Urobilinogen, UA: 0.2 E.U./dL
pH, UA: 6 (ref 5.0–8.0)

## 2019-09-16 NOTE — Progress Notes (Signed)
Patient comes in today for pre physical labs and EKG.

## 2019-09-16 NOTE — Progress Notes (Signed)
   Subjective:ADD    Patient ID: Kirk Smith, male    DOB: Dec 22, 1975, 44 y.o.   MRN: [id number]  HPI Patient request consult to psychiatry for evaluation of ADD.  Patient is a long history of this disorder but normally has been able to manage without medications.  Patient with a family history is also positive disorder with siblings diagnosed and being treated for the same complaint.  Patient admits now to increase attention deficit with multiple task.  Patient state constantly has to make sticky notes, phone notes, and notification from employees about pending tasks.   Review of Systems Obesity, hyperlipidemia, hypertension, GERD, and erectile dysfunction    Objective:   Physical Exam No acute distress.  Appears anxious.  Morbid obesity.  Rapid speech. HEENT is unremarkable.  Neck is supple without adenopathy or bruits.  Lungs are clear to auscultation.  Heart regular rate and rhythm.      Assessment & Plan: ADD versus anxiety.  Consult to psychiatry for definitive evaluation.

## 2019-09-16 NOTE — Addendum Note (Signed)
Addended by: Gardner Candle on: 09/16/2019 03:56 PM   Modules accepted: Orders

## 2019-09-17 LAB — CMP12+LP+TP+TSH+6AC+PSA+CBC…
ALT: 43 IU/L (ref 0–44)
AST: 32 IU/L (ref 0–40)
Albumin/Globulin Ratio: 1.6 (ref 1.2–2.2)
Albumin: 4.7 g/dL (ref 4.0–5.0)
Alkaline Phosphatase: 89 IU/L (ref 39–117)
BUN/Creatinine Ratio: 9 (ref 9–20)
BUN: 9 mg/dL (ref 6–24)
Basophils Absolute: 0.1 10*3/uL (ref 0.0–0.2)
Basos: 1 %
Bilirubin Total: 0.5 mg/dL (ref 0.0–1.2)
Calcium: 9.5 mg/dL (ref 8.7–10.2)
Chloride: 100 mmol/L (ref 96–106)
Chol/HDL Ratio: 4.9 ratio (ref 0.0–5.0)
Cholesterol, Total: 162 mg/dL (ref 100–199)
Creatinine, Ser: 0.95 mg/dL (ref 0.76–1.27)
EOS (ABSOLUTE): 0.1 10*3/uL (ref 0.0–0.4)
Eos: 1 %
Estimated CHD Risk: 1 times avg. (ref 0.0–1.0)
Free Thyroxine Index: 1.7 (ref 1.2–4.9)
GFR calc Af Amer: 114 mL/min/{1.73_m2} (ref >59)
GFR calc non Af Amer: 98 mL/min/{1.73_m2} (ref >59)
GGT: 44 IU/L (ref 0–65)
Globulin, Total: 3 g/dL (ref 1.5–4.5)
Glucose: 86 mg/dL (ref 65–99)
HDL: 33 mg/dL — ABNORMAL LOW (ref >39)
Hematocrit: 46.8 % (ref 37.5–51.0)
Hemoglobin: 16.1 g/dL (ref 13.0–17.7)
Immature Grans (Abs): 0 10*3/uL (ref 0.0–0.1)
Immature Granulocytes: 0 %
Iron: 125 ug/dL (ref 38–169)
LDH: 178 IU/L (ref 121–224)
LDL Chol Calc (NIH): 74 mg/dL (ref 0–99)
Lymphocytes Absolute: 2.6 10*3/uL (ref 0.7–3.1)
Lymphs: 34 %
MCH: 30.8 pg (ref 26.6–33.0)
MCHC: 34.4 g/dL (ref 31.5–35.7)
MCV: 90 fL (ref 79–97)
Monocytes Absolute: 0.6 10*3/uL (ref 0.1–0.9)
Monocytes: 7 %
Neutrophils Absolute: 4.3 10*3/uL (ref 1.4–7.0)
Neutrophils: 57 %
Phosphorus: 3.8 mg/dL (ref 2.8–4.1)
Platelets: 273 10*3/uL (ref 150–450)
Potassium: 4.7 mmol/L (ref 3.5–5.2)
Prostate Specific Ag, Serum: 0.8 ng/mL (ref 0.0–4.0)
RBC: 5.22 x10E6/uL (ref 4.14–5.80)
RDW: 12.2 % (ref 11.6–15.4)
Sodium: 140 mmol/L (ref 134–144)
T3 Uptake Ratio: 23 % — ABNORMAL LOW (ref 24–39)
T4, Total: 7.4 ug/dL (ref 4.5–12.0)
TSH: 1.42 u[IU]/mL (ref 0.450–4.500)
Total Protein: 7.7 g/dL (ref 6.0–8.5)
Triglycerides: 344 mg/dL — ABNORMAL HIGH (ref 0–149)
Uric Acid: 6.9 mg/dL (ref 3.8–8.4)
VLDL Cholesterol Cal: 55 mg/dL — ABNORMAL HIGH (ref 5–40)
WBC: 7.6 10*3/uL (ref 3.4–10.8)

## 2019-11-23 DIAGNOSIS — H5213 Myopia, bilateral: Secondary | ICD-10-CM | POA: Diagnosis not present

## 2019-11-30 DIAGNOSIS — R41840 Attention and concentration deficit: Secondary | ICD-10-CM | POA: Diagnosis not present

## 2019-12-14 ENCOUNTER — Encounter: Payer: Self-pay | Admitting: Physician Assistant

## 2019-12-21 ENCOUNTER — Ambulatory Visit: Payer: Self-pay | Admitting: Emergency Medicine

## 2019-12-21 ENCOUNTER — Encounter: Payer: Self-pay | Admitting: Emergency Medicine

## 2019-12-21 ENCOUNTER — Other Ambulatory Visit: Payer: Self-pay

## 2019-12-21 VITALS — BP 119/77 | HR 84 | Temp 96.9°F | Resp 12 | Ht 71.0 in | Wt 320.0 lb

## 2019-12-21 DIAGNOSIS — E781 Pure hyperglyceridemia: Secondary | ICD-10-CM

## 2019-12-21 DIAGNOSIS — M6530 Trigger finger, unspecified finger: Secondary | ICD-10-CM | POA: Insufficient documentation

## 2019-12-21 DIAGNOSIS — Z0000 Encounter for general adult medical examination without abnormal findings: Secondary | ICD-10-CM

## 2019-12-21 DIAGNOSIS — M722 Plantar fascial fibromatosis: Secondary | ICD-10-CM

## 2019-12-21 DIAGNOSIS — G5600 Carpal tunnel syndrome, unspecified upper limb: Secondary | ICD-10-CM | POA: Insufficient documentation

## 2019-12-21 DIAGNOSIS — K219 Gastro-esophageal reflux disease without esophagitis: Secondary | ICD-10-CM

## 2019-12-21 MED ORDER — ESOMEPRAZOLE MAGNESIUM 20 MG PO CPDR: 20.0000 mg | DELAYED_RELEASE_CAPSULE | Freq: Every day | ORAL | 3 refills | Status: DC

## 2019-12-21 NOTE — Progress Notes (Signed)
I have reviewed the triage vital signs and the nursing notes.   HISTORY  Chief Complaint Annual Exam  HPI Kirk Smith is a 44 y.o. male is here for annual physical.  He also states he needs a refill on his Nexium.  Patient also had surgery on his left foot and is recovering well from it.  He also still undergoing test to see if he has ADD or ADHD but the doctor prescribed Adderall XR 15 mg capsule.       Past Medical History:  Diagnosis Date  . Acute medial meniscus tear   . Bulging lumbar disc   . CAD (coronary artery disease)   . Diastolic dysfunction    a. 09/2010 Echo: EF 55-60%, no rwma, mild LVH, gr1 DD. Mildly dil LA.  Marland Kitchen HLD (hyperlipidemia)   . HTN (hypertension)   . Kidney stone   . Morbid obesity ([place])   . Palpitations    a. H/o tachycardia.  . Sessile colonic polyp 2019    Patient Active Problem List   Diagnosis Date Noted  . Acquired trigger finger 12/21/2019  . Carpal tunnel syndrome 12/21/2019  . Erectile dysfunction 01/22/2019  . History of arthroscopic knee surgery 01/22/2019  . Acute lateral meniscus tear of right knee 12/18/2018  . Encounter for long-term (current) use of high-risk medication 10/08/2018  . Stiffness of joints of both hands 09/25/2018  . Swelling of finger joint of left hand 09/25/2018  . Trigger thumb of right hand 08/20/2017  . Trigger finger of right hand 03/20/2017  . Family history of gout 09/15/2015  . Family history of psoriasis 09/15/2015  . Finger pain, left 09/15/2015  . Hip joint inflamed 09/01/2015  . Rotator cuff tear 09/23/2014  . DDD (degenerative disc disease), lumbar 03/23/2014  . Lumbar paraspinal muscle spasm 03/23/2014  . Hyperlipidemia 08/16/2010  . Obesity 08/16/2010  . Essential hypertension 08/16/2010  . Tachycardia 08/16/2010  . SHORTNESS OF BREATH 08/16/2010    Past Surgical History:  Procedure Laterality Date  . CARPAL TUNNEL RELEASE Bilateral   . COLONOSCOPY    . KNEE ARTHROSCOPY Right  09/08/2015   Procedure: ARTHROSCOPY KNEE, partial lateral menisectomy, removal plica, partial synovectomy.;  Surgeon: Thornton Park, MD;  Location: [facility] ORS;  Service: Orthopedics;  Laterality: Right;  . LITHOTRIPSY    . MENISCUS REPAIR    . TONSILLECTOMY      Prior to Admission medications   Medication Sig Start Date End Date Taking? Authorizing Provider  amphetamine-dextroamphetamine (ADDERALL XR) 15 MG 24 hr capsule Take 15 mg by mouth every morning.   Yes [provider]  atorvastatin (LIPITOR) 40 MG tablet TAKE 1 TABLET BY MOUTH EVERY DAY 08/06/18  Yes Gollan, Kathlene November, MD  Coenzyme Q10 10 MG capsule Take by mouth.   Yes [provider]  meloxicam (MOBIC) 15 MG tablet Take 15 mg by mouth daily. Take half a tablet   Yes [provider]  metoprolol tartrate (LOPRESSOR) 25 MG tablet Take 1 tablet (25 mg total) by mouth 2 (two) times daily. 06/11/19  Yes Theora Gianotti, NP  Omega-3 Fatty Acids (FISH OIL) 1000 MG CAPS Take 1 capsule by mouth daily.   Yes [provider]  sildenafil (REVATIO) 20 MG tablet Two-three tabs once daily as directed prior to planned activities 01/22/19  Yes Towanda Malkin, MD  esomeprazole (NEXIUM) 20 MG capsule Take 1 capsule (20 mg total) by mouth daily. 12/21/19   Johnn Hai, PA-C  methocarbamol (ROBAXIN)  750 MG tablet Take 375-750 mg by mouth. Take one tablet prn Patient not taking: Reported on 12/21/2019    [provider]  sulfaSALAzine (AZULFIDINE) 500 MG EC tablet Take 2 tabs twice a day for 30 days , 120 tabs 11/12/18   [provider]    Allergies Penicillins, Phenergan [promethazine hcl], and Promethazine  Family History  Problem Relation Age of Onset  . Heart failure Father        CHF  . Heart disease Brother        heart problems    Social History Social History   Tobacco Use  . Smoking status: Never Smoker  . Smokeless tobacco: Former Neurosurgeon    Types: Chew   Substance Use Topics  . Alcohol use: Yes    Alcohol/week: 1.0 standard drink    Types: 1 Standard drinks or equivalent per week    Comment: occasional   . Drug use: No    Review of Systems Constitutional: No fever/chills Eyes: No visual changes. ENT: No sore throat. Cardiovascular: Denies chest pain. Respiratory: Denies shortness of breath. Gastrointestinal: No abdominal pain.    Musculoskeletal: Positive plantars fasciitis left foot recent surgery.  Right knee recent surgery. Skin: Negative for rash. Neurological: Negative for headaches, focal weakness or numbness. ____________________________________________   PHYSICAL EXAM: Constitutional: Alert and oriented. Well appearing and in no acute distress. Eyes: Conjunctivae are normal. PERRL. EOMI. Head: Atraumatic. Nose: No congestion/rhinnorhea. Neck: No stridor.  Cardiovascular: Normal rate, regular rhythm. Grossly normal heart sounds.  Good peripheral circulation. Respiratory: Normal respiratory effort.  No retractions. Lungs CTAB. Gastrointestinal: Soft, nontender.  Bowel sounds are normoactive x4 quadrants. Musculoskeletal: Nontender thoracic or lumbar spine.  Patient is able move upper extremities without any difficulty.  Currently he has a postoperative boot on his left foot and ankle.  Right lower extremity without difficulty with range of motion and patient is able to bear weight on his left foot and walked without any assistance. Neurologic:  Normal speech and language. No gross focal neurologic deficits are appreciated. No gait instability. Skin:  Skin is warm, dry and intact. No rash noted. Psychiatric: Mood and affect are normal. Speech and behavior are normal.  ____________________________________________   LABS (all labs ordered are listed, but only abnormal results are displayed)  Lab work was discussed.  Lab work was done in March 2021. ____________________________________________  EKG  Normal sinus  rhythm with ventricular rate of 86.  This EKG was done also in March 2021. ____________________________________________   FINAL CLINICAL IMPRESSION(S) / ED DIAGNOSES  Normal annual physical Improving plantars fasciitis Hypertriglyceridemia   We discussed lab work, weight gain and his inability to get into the gym and workout as he would like to due to his surgeries.  Patient will make an extra effort to watch what he is eating.  Also he plans to go back to the gym as soon as his cam walker is off.  Medication may be needed if lipids are not improving.   ED Discharge Orders         Ordered    EKG 12-Lead     Discontinue     12/21/19 1425           Note:  This document was prepared using Dragon voice recognition software and may include unintentional dictation errors.

## 2019-12-21 NOTE — Progress Notes (Signed)
Undergoing psyche eval for ADHD - has been put on low dose of Adderall.  Has a follow-up appt with physician this month. AMD

## 2020-01-01 ENCOUNTER — Telehealth: Payer: Self-pay

## 2020-01-01 DIAGNOSIS — Z79899 Other long term (current) drug therapy: Secondary | ICD-10-CM | POA: Diagnosis not present

## 2020-01-01 DIAGNOSIS — R41840 Attention and concentration deficit: Secondary | ICD-10-CM | POA: Diagnosis not present

## 2020-01-01 DIAGNOSIS — R69 Illness, unspecified: Secondary | ICD-10-CM | POA: Diagnosis not present

## 2020-01-01 MED ORDER — ATORVASTATIN CALCIUM 40 MG PO TABS: 40.0000 mg | ORAL_TABLET | Freq: Every day | ORAL | 0 refills | Status: DC

## 2020-01-01 MED ORDER — METOPROLOL TARTRATE 25 MG PO TABS: 25.0000 mg | ORAL_TABLET | Freq: Two times a day (BID) | ORAL | 0 refills | Status: DC

## 2020-01-01 NOTE — Telephone Encounter (Signed)
-----   Message from Festus Aloe, CMA sent at 01/01/2020 11:49 AM EDT ----- Regarding: RE: appt for refills  ----- Message ----- From: Joline Maxcy Sent: 01/01/2020  10:31 AM EDT To: [facility] Refills Subject: FW: appt for refills                           Patient states he was told my Gollan fu of needed. He is unable to get rx at pcp as the city just hired a new md and he hasnt started.    Would Gollan send in a 60 day refill until he can have an ov with new pcp ?  ----- Message ----- From: Loman Chroman Sent: 12/29/2019   9:26 AM EDT To: Loni Muse [facility] Scheduling Subject: FW: appt for refills                           LVM for patient to schedule ----- Message ----- From: Margrett Rud, CMA Sent: 12/29/2019   9:11 AM EDT To: Mickie Bail Burl Scheduling Subject: appt for refills                               Patient needs an appointment for further refills. If patient does not want to schedule an appointment please make them aware to contact PCP for refills. Patient was last seen 09/2018 Thanks Ladies!

## 2020-01-01 NOTE — Telephone Encounter (Signed)
Ok to refill for 30 days with no refills.

## 2020-02-04 ENCOUNTER — Ambulatory Visit: Payer: Self-pay | Admitting: Emergency Medicine

## 2020-02-04 ENCOUNTER — Encounter: Payer: Self-pay | Admitting: Emergency Medicine

## 2020-02-04 ENCOUNTER — Other Ambulatory Visit: Payer: Self-pay

## 2020-02-04 VITALS — BP 135/88 | HR 82 | Temp 97.0°F | Resp 16 | Ht 70.0 in | Wt 318.0 lb

## 2020-02-04 DIAGNOSIS — Z760 Encounter for issue of repeat prescription: Secondary | ICD-10-CM

## 2020-02-04 MED ORDER — METOPROLOL TARTRATE 25 MG PO TABS: 25.0000 mg | ORAL_TABLET | Freq: Two times a day (BID) | ORAL | 12 refills | Status: DC

## 2020-02-04 NOTE — Progress Notes (Signed)
  Subjective:     Patient ID: Kirk Smith, male   DOB: Feb 24, 1976, 44 y.o.   MRN: [id number]  HPI Here for refill of medication. Also was told by his doctor who he sees for ADHD that he needs a sleep study done.     Objective:   Physical Exam Lungs are clear bilaterally. Heart regular rate and rhythm without murmur.    Assessment:     Encounter for medication refill.    Plan:     Metoprolol 25 mg was sent to his pharmacy with a year supply.  Patient will call his insurance carrier to see which sleep study clinic in [facility] will be covered the most.  We will then order the sleep study for him.

## 2020-02-04 NOTE — Progress Notes (Signed)
Previous doctor prescribed him metoprolol tartrate 25 bid am and pm told him to go to his pcp to have a rx because its expensive to keep coming for the rx.

## 2020-02-29 ENCOUNTER — Other Ambulatory Visit: Payer: Self-pay | Admitting: Cardiovascular Disease

## 2020-03-01 ENCOUNTER — Other Ambulatory Visit: Payer: Self-pay

## 2020-03-01 DIAGNOSIS — N529 Male erectile dysfunction, unspecified: Secondary | ICD-10-CM

## 2020-03-01 MED ORDER — SILDENAFIL CITRATE 20 MG PO TABS: ORAL_TABLET | ORAL | 5 refills | Status: DC

## 2020-03-03 ENCOUNTER — Ambulatory Visit: Payer: Self-pay | Admitting: Emergency Medicine

## 2020-03-03 ENCOUNTER — Other Ambulatory Visit: Payer: Self-pay

## 2020-03-03 VITALS — BP 126/86 | HR 104 | Temp 97.9°F | Resp 14 | Ht 71.0 in | Wt 318.0 lb

## 2020-03-03 DIAGNOSIS — E781 Pure hyperglyceridemia: Secondary | ICD-10-CM

## 2020-03-03 MED ORDER — ATORVASTATIN CALCIUM 40 MG PO TABS: 40.0000 mg | ORAL_TABLET | Freq: Every day | ORAL | 0 refills | Status: DC

## 2020-03-03 NOTE — Progress Notes (Signed)
Pt concerned if he needs his cholesterol medication or if it needs to be adjusted. CL,RMA

## 2020-03-03 NOTE — Progress Notes (Signed)
  Occupational Health Provider Note       Time seen: 2:31 PM    I have reviewed the vital signs and the nursing notes.  HISTORY   Chief Complaint Medication Refill   HPI Kirk Smith is a 44 y.o. male with a history of coronary artery disease, hyperlipidemia, hypertension, morbid obesity, kidney stones who presents today for cholesterol medication refill.  Patient has not been exercising frequently.  Past Medical History:  Diagnosis Date  . Acute medial meniscus tear   . Bulging lumbar disc   . CAD (coronary artery disease)   . Diastolic dysfunction    a. 09/2010 Echo: EF 55-60%, no rwma, mild LVH, gr1 DD. Mildly dil LA.  Marland Kitchen HLD (hyperlipidemia)   . HTN (hypertension)   . Kidney stone   . Morbid obesity (HCC)   . Palpitations    a. H/o tachycardia.  . Sessile colonic polyp 2019    Past Surgical History:  Procedure Laterality Date  . CARPAL TUNNEL RELEASE Bilateral   . COLONOSCOPY    . KNEE ARTHROSCOPY Right 09/08/2015   Procedure: ARTHROSCOPY KNEE, partial lateral menisectomy, removal plica, partial synovectomy.;  Surgeon: Juanell Fairly, MD;  Location: [facility] ORS;  Service: Orthopedics;  Laterality: Right;  . LITHOTRIPSY    . MENISCUS REPAIR    . TONSILLECTOMY      Allergies Penicillins, Phenergan [promethazine hcl], and Promethazine  ____________________________________________   PHYSICAL EXAM:  VITAL SIGNS: Vitals:   03/03/20 1349  BP: 126/86  Pulse: (!) 104  Resp: 14  Temp: 97.9 F (36.6 C)  SpO2: 97%    Constitutional: Alert and oriented. Well appearing and in no distress. Eyes: Conjunctivae are normal.  Musculoskeletal: normal range of motion in extremities.  Neurologic:  Normal speech and language. No gross focal neurologic deficits are appreciated.  Skin:  Skin is warm, dry and intact. No rash noted. Psychiatric: Speech and behavior are normal.   ____________________________________________   LABS (pertinent positives/negatives)  No  results found for this or any previous visit (from the past 2160 hour(s)).   ASSESSMENT AND PLAN  Hyperlipidemia   Plan: The patient had presented for prescription refill.  Recent labs do reveal moderate hypertriglyceridemia.  I will refill his statin therapy and he will attempt to increase his exercise regimen.  He is cleared for follow-up in 6 months.  Daryel November MD    Note: This note was generated in part or whole with voice recognition software. Voice recognition is usually quite accurate but there are transcription errors that can and very often do occur. I apologize for any typographical errors that were not detected and corrected.

## 2020-04-01 ENCOUNTER — Other Ambulatory Visit: Payer: Self-pay | Admitting: Emergency Medicine

## 2020-04-01 DIAGNOSIS — E781 Pure hyperglyceridemia: Secondary | ICD-10-CM

## 2020-04-05 DIAGNOSIS — G4733 Obstructive sleep apnea (adult) (pediatric): Secondary | ICD-10-CM | POA: Diagnosis not present

## 2020-04-28 DIAGNOSIS — G4733 Obstructive sleep apnea (adult) (pediatric): Secondary | ICD-10-CM | POA: Diagnosis not present

## 2020-05-17 DIAGNOSIS — G4733 Obstructive sleep apnea (adult) (pediatric): Secondary | ICD-10-CM | POA: Diagnosis not present

## 2020-05-17 DIAGNOSIS — F902 Attention-deficit hyperactivity disorder, combined type: Secondary | ICD-10-CM | POA: Diagnosis not present

## 2020-05-17 DIAGNOSIS — R69 Illness, unspecified: Secondary | ICD-10-CM | POA: Diagnosis not present

## 2020-05-17 DIAGNOSIS — Z79899 Other long term (current) drug therapy: Secondary | ICD-10-CM | POA: Diagnosis not present

## 2020-05-29 DIAGNOSIS — G4733 Obstructive sleep apnea (adult) (pediatric): Secondary | ICD-10-CM | POA: Diagnosis not present

## 2020-05-30 ENCOUNTER — Ambulatory Visit (INDEPENDENT_AMBULATORY_CARE_PROVIDER_SITE_OTHER): Payer: 59 | Admitting: Internal Medicine

## 2020-05-30 VITALS — BP 138/88 | HR 85 | Temp 97.9°F | Resp 16 | Ht 71.0 in | Wt 315.0 lb

## 2020-05-30 DIAGNOSIS — Z7189 Other specified counseling: Secondary | ICD-10-CM

## 2020-05-30 DIAGNOSIS — G4733 Obstructive sleep apnea (adult) (pediatric): Secondary | ICD-10-CM | POA: Diagnosis not present

## 2020-05-30 DIAGNOSIS — I10 Essential (primary) hypertension: Secondary | ICD-10-CM

## 2020-05-30 NOTE — Patient Instructions (Signed)

## 2020-05-30 NOTE — Progress Notes (Signed)
[facility] [address] [place], [place] [address]  Pulmonary Sleep Medicine   Office Visit Note  Patient Name: Kirk Smith DOB: Apr 03, 1976 MRN [id number]    Chief Complaint: Obstructive Sleep Apnea visit  Brief History:  Kirk Smith is seen today for initial consultation to discuss the results of his recent sleep studies demonstrating  Severe obstructive sleep apnea, APAP 5-20 was recommended. The patient was told by his dentist that he may have sleep apnea. He has also been told that he stops breathing at night.  He has a several year history of loud snoring.  His sleep time is restricted due to his work schedule and when he sleeps he sleeps hard.  He does not feel rested when he wakes.   Patient is using PAP nightly.  The patient feels more rested after sleeping with PAP.  The patient reports benefiting from PAP use. Reported sleepiness is  improved and the Epworth Sleepiness Score is 10 out of 24. The patient rarely take naps. The patient complains of the following: condensation. This causes him to remove the mask.   The compliance download showsfair  compliance with an average use time of 5.2 hours. The AHI is 1.8  The patient does not complain of limb movements disrupting sleep.  ROS  General: (-) fever, (-) chills, (-) night sweat Nose and Sinuses: (-) nasal stuffiness or itchiness, (-) postnasal drip, (-) nosebleeds, (-) sinus trouble. Mouth and Throat: (-) sore throat, (-) hoarseness. Neck: (-) swollen glands, (-) enlarged thyroid, (-) neck pain. Respiratory: - cough, - shortness of breath, - wheezing. Neurologic: - numbness, - tingling. Psychiatric: - anxiety, - depression   Current Medication: Outpatient Encounter Medications as of 05/30/2020  Medication Sig  . ADDERALL XR 30 MG 24 hr capsule Take 30 mg by mouth daily.  Marland Kitchen amphetamine-dextroamphetamine (ADDERALL XR) 15 MG 24 hr capsule Take 30 mg by mouth every morning.   Marland Kitchen atorvastatin (LIPITOR) 40 MG tablet  TAKE 1 TABLET BY MOUTH DAILY.  Marland Kitchen Coenzyme Q10 10 MG capsule Take by mouth.  . esomeprazole (NEXIUM) 20 MG capsule Take 1 capsule (20 mg total) by mouth daily.  . meloxicam (MOBIC) 15 MG tablet Take 15 mg by mouth daily. Take half a tablet  . metoprolol tartrate (LOPRESSOR) 25 MG tablet Take 1 tablet (25 mg total) by mouth 2 (two) times daily.  . Omega-3 Fatty Acids (FISH OIL) 1000 MG CAPS Take 1 capsule by mouth daily.  . sildenafil (REVATIO) 20 MG tablet Two-three tabs once daily as directed prior to planned activities   No facility-administered encounter medications on file as of 05/30/2020.    Surgical History: Past Surgical History:  Procedure Laterality Date  . CARPAL TUNNEL RELEASE Bilateral   . COLONOSCOPY    . KNEE ARTHROSCOPY Right 09/08/2015   Procedure: ARTHROSCOPY KNEE, partial lateral menisectomy, removal plica, partial synovectomy.;  Surgeon: Juanell Fairly, MD;  Location: [facility] ORS;  Service: Orthopedics;  Laterality: Right;  . LITHOTRIPSY    . MENISCUS REPAIR    . TONSILLECTOMY      Medical History: Past Medical History:  Diagnosis Date  . Acute medial meniscus tear   . Bulging lumbar disc   . CAD (coronary artery disease)   . Diastolic dysfunction    a. 09/2010 Echo: EF 55-60%, no rwma, mild LVH, gr1 DD. Mildly dil LA.  Marland Kitchen HLD (hyperlipidemia)   . HTN (hypertension)   . Kidney stone   . Morbid obesity (HCC)   . Palpitations  a. H/o tachycardia.  . Sessile colonic polyp 2019    Family History: Non contributory to the present illness  Social History: Social History   Socioeconomic History  . Marital status: Divorced    Spouse name: Not on file  . Number of children: Not on file  . Years of education: Not on file  . Highest education level: Not on file  Occupational History  . Not on file  Tobacco Use  . Smoking status: Never Smoker  . Smokeless tobacco: Former Neurosurgeon    Types: Chew  Substance and Sexual Activity  . Alcohol use: Yes     Alcohol/week: 1.0 standard drink    Types: 1 Standard drinks or equivalent per week    Comment: occasional   . Drug use: No  . Sexual activity: Not on file  Other Topics Concern  . Not on file  Social History Narrative    full time, does not get regular exercise.    Social Determinants of Health   Financial Resource Strain:   . Difficulty of Paying Living Expenses: Not on file  Food Insecurity:   . Worried About Programme researcher, broadcasting/film/video in the Last Year: Not on file  . Ran Out of Food in the Last Year: Not on file  Transportation Needs:   . Lack of Transportation (Medical): Not on file  . Lack of Transportation (Non-Medical): Not on file  Physical Activity:   . Days of Exercise per Week: Not on file  . Minutes of Exercise per Session: Not on file  Stress:   . Feeling of Stress : Not on file  Social Connections:   . Frequency of Communication with Friends and Family: Not on file  . Frequency of Social Gatherings with Friends and Family: Not on file  . Attends Religious Services: Not on file  . Active Member of Clubs or Organizations: Not on file  . Attends Banker Meetings: Not on file  . Marital Status: Not on file  Intimate Partner Violence:   . Fear of Current or Ex-Partner: Not on file  . Emotionally Abused: Not on file  . Physically Abused: Not on file  . Sexually Abused: Not on file    Vital Signs: There were no vitals taken for this visit.  Examination: General Appearance: The patient is well-developed, well-nourished, and in no distress. Neck Circumference: 47 Skin: Gross inspection of skin unremarkable. Head: normocephalic, no gross deformities. Eyes: no gross deformities noted. ENT: ears appear grossly normal Neurologic: Alert and oriented. No involuntary movements.    EPWORTH SLEEPINESS SCALE:  Scale:  (0)= no chance of dozing; (1)= slight chance of dozing; (2)= moderate chance of dozing; (3)= high chance of dozing  Chance  Situtation     Sitting and reading: 3    Watching TV: 2    Sitting Inactive in public: 1    As a passenger in car: 1      Lying down to rest: 1    Sitting and talking: 1    Sitting quielty after lunch: 1    In a car, stopped in traffic: 0   TOTAL SCORE:   10 out of 24    SLEEP STUDIES:  1. HST  04/05/20  -  RDI  29.5,  lowest saturation 79%   CPAP COMPLIANCE DATA:  Date Range: 04/30/20 - 05/29/20  Average Daily Use: 5:01 hours  Median Use: 5:18  Compliance for > 4 Hours: 70%  AHI: 1.8 respiratory events per hour  Days Used: 29/30  Mask Leak: 14.9 L/min  95th Percentile Pressure: 13.6 cmH2O        LABS: No results found for this or any previous visit (from the past 2160 hour(s)).  Radiology: US RT UPPER EXTREM LTD SOFT TISSUE NON VASCULAR  Result Date: 04/03/2018 CLINICAL DATA:  Trigger finger of the third right digit EXAM: ULTRASOUND RIGHT UPPER EXTREMITY LIMITED TECHNIQUE: Ultrasound examination of the upper extremity soft tissues was performed in the area of clinical concern. COMPARISON:  None FINDINGS: Real-time sonography of the right third digit was performed with a high-frequency linear transducer. The flexor tendon is intact and normal in echogenicity. There is tenosynovial thickening at the level of the third MCP joint with thickening of the A1 pulley. There is mild thickening of the A1 pulley of first MCP joint. There is no abnormal Doppler flow. IMPRESSION: 1. Tenosynovial thickening involving the third flexor digitorum tendon sheath at the level of the third MCP joint with thickening of the A1 pulley. 2. Mild thickening of the A1 pulley of the first MCP joint. Electronically Signed   By: Elige KoHetal  Patel   On: 04/03/2018 17:08    No results found.  No results found.    Assessment and Plan: Patient Active Problem List   Diagnosis Date Noted  . Acquired trigger finger 12/21/2019  . Carpal tunnel syndrome 12/21/2019  . Erectile dysfunction 01/22/2019  .  History of arthroscopic knee surgery 01/22/2019  . Acute lateral meniscus tear of right knee 12/18/2018  . Encounter for long-term (current) use of high-risk medication 10/08/2018  . Stiffness of joints of both hands 09/25/2018  . Swelling of finger joint of left hand 09/25/2018  . Trigger thumb of right hand 08/20/2017  . Trigger finger of right hand 03/20/2017  . Family history of gout 09/15/2015  . Family history of psoriasis 09/15/2015  . Finger pain, left 09/15/2015  . Hip joint inflamed 09/01/2015  . Rotator cuff tear 09/23/2014  . DDD (degenerative disc disease), lumbar 03/23/2014  . Lumbar paraspinal muscle spasm 03/23/2014  . Hyperlipidemia 08/16/2010  . Obesity 08/16/2010  . Essential hypertension 08/16/2010  . Tachycardia 08/16/2010  . SHORTNESS OF BREATH 08/16/2010      The study results, diagnosis and treatment recommendations were discussed with the patient.The patient does tolerate PAP and reports significant benefit from PAP use. He understands that he is not getting sufficient sleep.The patient was reminded how to clean his CPAP and advised to increase the humidfiier and tubing setting. The patient was also counselled on the role of weight loss in treating sleep apnea. The compliance is fair. The apnea is well controlled.  1. OSA- continue nightly use of CPAP. Follow up in 4 months 2. CPAP couseling-Discussed importance of adequate CPAP use as well as proper care and cleaning techniques of machine and all supplies. 3. HTN - controlled on BP meds. Followed by PCP. 4. Obesity discussed  Obesity Counseling: Risk Assessment: An assessment of behavioral risk factors was made today and includes lack of exercise sedentary lifestyle, lack of portion control and poor dietary habits.  Risk Modification Advice: She was counseled on portion control guidelines. Restricting daily caloric intake to. . The detrimental long term effects of obesity on her health and ongoing poor  compliance was also discussed with the patient.    General Counseling: I have discussed the findings of the evaluation and examination with Kirk Huaavid.  I have also discussed any further diagnostic evaluation thatmay be needed or ordered today. Kirk Huaavid verbalizes  understanding of the findings of todays visit. We also reviewed his medications today and discussed drug interactions and side effects including but not limited excessive drowsiness and altered mental states. We also discussed that there is always a risk not just to him but also people around him. he has been encouraged to call the office with any questions or concerns that should arise related to todays visit.  No orders of the defined types were placed in this encounter.    This patient was seen by Layla Barter, AGNP-C in collaboration with Dr. Freda Munro as a part of collaborative care agreement.    I have personally obtained a history, examined the patient, evaluated laboratory and imaging results, formulated the assessment and plan and placed orders.   Valentino Hue Sol Blazing, PhD, FAASM  Diplomate, American Board of Sleep Medicine    Yevonne Pax, MD [facility] Diplomate ABMS Pulmonary and Critical Care Medicine Sleep medicine

## 2020-06-28 DIAGNOSIS — G4733 Obstructive sleep apnea (adult) (pediatric): Secondary | ICD-10-CM | POA: Diagnosis not present

## 2020-07-29 DIAGNOSIS — G4733 Obstructive sleep apnea (adult) (pediatric): Secondary | ICD-10-CM | POA: Diagnosis not present

## 2020-08-11 DIAGNOSIS — M62830 Muscle spasm of back: Secondary | ICD-10-CM | POA: Diagnosis not present

## 2020-08-11 DIAGNOSIS — M5136 Other intervertebral disc degeneration, lumbar region: Secondary | ICD-10-CM | POA: Diagnosis not present

## 2020-08-18 DIAGNOSIS — R69 Illness, unspecified: Secondary | ICD-10-CM | POA: Diagnosis not present

## 2020-08-18 DIAGNOSIS — G4733 Obstructive sleep apnea (adult) (pediatric): Secondary | ICD-10-CM | POA: Diagnosis not present

## 2020-08-18 DIAGNOSIS — Z79899 Other long term (current) drug therapy: Secondary | ICD-10-CM | POA: Diagnosis not present

## 2020-08-18 DIAGNOSIS — F902 Attention-deficit hyperactivity disorder, combined type: Secondary | ICD-10-CM | POA: Diagnosis not present

## 2020-08-29 DIAGNOSIS — G4733 Obstructive sleep apnea (adult) (pediatric): Secondary | ICD-10-CM | POA: Diagnosis not present

## 2020-09-26 ENCOUNTER — Ambulatory Visit (INDEPENDENT_AMBULATORY_CARE_PROVIDER_SITE_OTHER): Payer: 59 | Admitting: Internal Medicine

## 2020-09-26 VITALS — BP 145/90 | HR 94 | Temp 99.0°F | Resp 16 | Ht 70.0 in | Wt 315.0 lb

## 2020-09-26 DIAGNOSIS — G4733 Obstructive sleep apnea (adult) (pediatric): Secondary | ICD-10-CM | POA: Insufficient documentation

## 2020-09-26 DIAGNOSIS — I10 Essential (primary) hypertension: Secondary | ICD-10-CM

## 2020-09-26 DIAGNOSIS — Z6841 Body Mass Index (BMI) 40.0 and over, adult: Secondary | ICD-10-CM | POA: Diagnosis not present

## 2020-09-26 DIAGNOSIS — Z7189 Other specified counseling: Secondary | ICD-10-CM | POA: Diagnosis not present

## 2020-09-26 DIAGNOSIS — Z9989 Dependence on other enabling machines and devices: Secondary | ICD-10-CM

## 2020-09-26 NOTE — Patient Instructions (Signed)

## 2020-09-26 NOTE — Progress Notes (Unsigned)
[facility] [address] [place], [place] [address]  Pulmonary Sleep Medicine   Office Visit Note  Patient Name: Kirk MarrowDavid W Trawick DOB: 06/28/1976 MRN [id number]    Chief Complaint: Obstructive Sleep Apnea visit  Brief History:  Onalee HuaDavid is seen today for follow up The patient has a 6 months history of sleep apnea. Patient is mostly using PAP nightly.  The patient feels more rested after sleeping with PAP.  The patient reports benefiting. from PAP use. Reported sleepiness is  improved and the Epworth Sleepiness Score is 7 out of 24. The patient does not take naps.  He admits that as a single dad he does not get sufficient sleep. The patient complains of the following: adjusting the temp and humidity.  The compliance download shows fair  compliance with an average use time of 5.2 hours. The AHI is 1.8  The patient does not complain of limb movements disrupting sleep.  ROS  General: (-) fever, (-) chills, (-) night sweat Nose and Sinuses: (-) nasal stuffiness or itchiness, (-) postnasal drip, (-) nosebleeds, (-) sinus trouble. Mouth and Throat: (-) sore throat, (-) hoarseness. Neck: (-) swollen glands, (-) enlarged thyroid, (-) neck pain. Respiratory: - cough, - shortness of breath, - wheezing. Neurologic: - numbness, - tingling. Psychiatric: - anxiety, - depression   Current Medication: Outpatient Encounter Medications as of 09/26/2020  Medication Sig  . methocarbamol (ROBAXIN) 750 MG tablet 1/2-1 po bid prn  . amphetamine-dextroamphetamine (ADDERALL XR) 15 MG 24 hr capsule Take 30 mg by mouth every morning.   Marland Kitchen. atorvastatin (LIPITOR) 40 MG tablet TAKE 1 TABLET BY MOUTH DAILY.  Marland Kitchen. Coenzyme Q10 10 MG capsule Take by mouth.  . esomeprazole (NEXIUM) 20 MG capsule Take 1 capsule (20 mg total) by mouth daily.  Marland Kitchen. esomeprazole (NEXIUM) 20 MG capsule Take by mouth.  . meloxicam (MOBIC) 15 MG tablet Take 15 mg by mouth daily. Take half a tablet  . metoprolol tartrate (LOPRESSOR) 25  MG tablet Take 1 tablet (25 mg total) by mouth 2 (two) times daily.  Marland Kitchen. MYDAYIS 50 MG CP24 Take 1 capsule by mouth every morning.  . Omega-3 Fatty Acids (FISH OIL) 1000 MG CAPS Take 1 capsule by mouth daily.  . sildenafil (REVATIO) 20 MG tablet Two-three tabs once daily as directed prior to planned activities  . [DISCONTINUED] ADDERALL XR 30 MG 24 hr capsule Take 30 mg by mouth daily.   No facility-administered encounter medications on file as of 09/26/2020.    Surgical History: Past Surgical History:  Procedure Laterality Date  . CARPAL TUNNEL RELEASE Bilateral   . COLONOSCOPY    . KNEE ARTHROSCOPY Right 09/08/2015   Procedure: ARTHROSCOPY KNEE, partial lateral menisectomy, removal plica, partial synovectomy.;  Surgeon: Juanell FairlyKevin Krasinski, MD;  Location: [facility] ORS;  Service: Orthopedics;  Laterality: Right;  . LITHOTRIPSY    . MENISCUS REPAIR    . TONSILLECTOMY      Medical History: Past Medical History:  Diagnosis Date  . Acute medial meniscus tear   . Bulging lumbar disc   . CAD (coronary artery disease)   . Diastolic dysfunction    a. 09/2010 Echo: EF 55-60%, no rwma, mild LVH, gr1 DD. Mildly dil LA.  Marland Kitchen. HLD (hyperlipidemia)   . HTN (hypertension)   . Kidney stone   . Morbid obesity (HCC)   . Palpitations    a. H/o tachycardia.  . Sessile colonic polyp 2019    Family History: Non contributory to the present illness  Social  History: Social History   Socioeconomic History  . Marital status: Divorced    Spouse name: Not on file  . Number of children: Not on file  . Years of education: Not on file  . Highest education level: Not on file  Occupational History  . Not on file  Tobacco Use  . Smoking status: Never Smoker  . Smokeless tobacco: Former Neurosurgeon    Types: Chew  Substance and Sexual Activity  . Alcohol use: Yes    Alcohol/week: 1.0 standard drink    Types: 1 Standard drinks or equivalent per week    Comment: occasional   . Drug use: No  . Sexual activity: Not  on file  Other Topics Concern  . Not on file  Social History Narrative    full time, does not get regular exercise.    Social Determinants of Health   Financial Resource Strain: Not on file  Food Insecurity: Not on file  Transportation Needs: Not on file  Physical Activity: Not on file  Stress: Not on file  Social Connections: Not on file  Intimate Partner Violence: Not on file    Vital Signs: Blood pressure (!) 145/90, pulse 94, temperature 99 F (37.2 C), temperature source Temporal, resp. rate 16, height 5\' 10"  (1.778 m), weight (!) 315 lb (142.9 kg), SpO2 96 %.  Examination: General Appearance: The patient is well-developed, well-nourished, and in no distress. Neck Circumference: 46 Skin: Gross inspection of skin unremarkable. Head: normocephalic, no gross deformities. Eyes: no gross deformities noted. ENT: ears appear grossly normal Neurologic: Alert and oriented. No involuntary movements.    EPWORTH SLEEPINESS SCALE:  Scale:  (0)= no chance of dozing; (1)= slight chance of dozing; (2)= moderate chance of dozing; (3)= high chance of dozing  Chance  Situtation    Sitting and reading: 1    Watching TV: 2    Sitting Inactive in public: 0    As a passenger in car: 1      Lying down to rest: 1    Sitting and talking: 0    Sitting quielty after lunch: 2    In a car, stopped in traffic: 0   TOTAL SCORE:   7 out of 24    SLEEP STUDIES:  HST  04/05/20  -  RDI  29.5,  lowest saturation 79%  CPAP COMPLIANCE DATA  Date Range: :05/25/20 - 09/21/20  Average Daily Use: 5.2 hours  Median Use: 5:36  Compliance for > 4 Hours: 70%  AHI: 1.8 respiratory events per hour  Days Used: 29/30  Mask Leak: 14.9  95th Percentile Pressure: 13.6 cmH2O         LABS: No results found for this or any previous visit (from the past 2160 hour(s)).  Radiology: 2161 RT UPPER EXTREM LTD SOFT TISSUE NON VASCULAR  Result Date: 04/03/2018 CLINICAL DATA:  Trigger  finger of the third right digit EXAM: ULTRASOUND RIGHT UPPER EXTREMITY LIMITED TECHNIQUE: Ultrasound examination of the upper extremity soft tissues was performed in the area of clinical concern. COMPARISON:  None FINDINGS: Real-time sonography of the right third digit was performed with a high-frequency linear transducer. The flexor tendon is intact and normal in echogenicity. There is tenosynovial thickening at the level of the third MCP joint with thickening of the A1 pulley. There is mild thickening of the A1 pulley of first MCP joint. There is no abnormal Doppler flow. IMPRESSION: 1. Tenosynovial thickening involving the third flexor digitorum tendon sheath at the level of the third  MCP joint with thickening of the A1 pulley. 2. Mild thickening of the A1 pulley of the first MCP joint. Electronically Signed   By: Elige Ko   On: 04/03/2018 17:08    No results found.  No results found.    Assessment and Plan: Patient Active Problem List   Diagnosis Date Noted  . OSA on CPAP 09/26/2020  . Morbid obesity (HCC) 09/26/2020  . OSA (obstructive sleep apnea) 05/30/2020  . CPAP use counseling 05/30/2020  . Acquired trigger finger 12/21/2019  . Carpal tunnel syndrome 12/21/2019  . Erectile dysfunction 01/22/2019  . History of arthroscopic knee surgery 01/22/2019  . Acute lateral meniscus tear of right knee 12/18/2018  . Encounter for long-term (current) use of high-risk medication 10/08/2018  . Stiffness of joints of both hands 09/25/2018  . Swelling of finger joint of left hand 09/25/2018  . Trigger thumb of right hand 08/20/2017  . Trigger finger of right hand 03/20/2017  . Family history of gout 09/15/2015  . Family history of psoriasis 09/15/2015  . Finger pain, left 09/15/2015  . Hip joint inflamed 09/01/2015  . Rotator cuff tear 09/23/2014  . DDD (degenerative disc disease), lumbar 03/23/2014  . Lumbar paraspinal muscle spasm 03/23/2014  . Hyperlipidemia 08/16/2010  . Obesity  08/16/2010  . Essential hypertension 08/16/2010  . Tachycardia 08/16/2010  . SHORTNESS OF BREATH 08/16/2010    1. OSA on CPAP The patient does tolerate PAP and reports significant benefit from PAP use. His problem has been adjusting the temperature and humidity. He does not get sufficient sleep.  The patient was reminded how to adjust the temperature and humidity and advised to increase his time in bed. The patient was also counselled on the role of weight loss in treating sleep apnea. The compliance is fair. The apnea is controlled. OSA- increase time in bed and continue to use CPAP whenever sleeping.    2. CPAP use counseling CPAP Counseling: had a lengthy discussion with the patient regarding the importance of PAP therapy in management of the sleep apnea. Patient appears to understand the risk factor reduction and also understands the risks associated with untreated sleep apnea. Patient will try to make a good faith effort to remain compliant with therapy. Also instructed the patient on proper cleaning of the device including the water must be changed daily if possible and use of distilled water is preferred. Patient understands that the machine should be regularly cleaned with appropriate recommended cleaning solutions that do not damage the PAP machine for example given white vinegar and water rinses. Other methods such as ozone treatment may not be as good as these simple methods to achieve cleaning.  3. Morbid obesity (HCC) Obesity Counseling: Had a lengthy discussion regarding patients BMI and weight issues. Patient was instructed on portion control as well as increased activity. Also discussed caloric restrictions with trying to maintain intake less than 2000 Kcal. Discussions were made in accordance with the 5As of weight management. Simple actions such as not eating late and if able to, taking a walk is suggested.  4. Essential hypertension Hypertension Counseling:   The following  hypertensive lifestyle modification were recommended and discussed:  1. Limiting alcohol intake to less than 1 oz/day of ethanol:(24 oz of beer or 8 oz of wine or 2 oz of 100-proof whiskey). 2. Take baby ASA 81 mg daily. 3. Importance of regular aerobic exercise and losing weight. 4. Reduce dietary saturated fat and cholesterol intake for overall cardiovascular health. 5. Maintaining adequate  dietary potassium, calcium, and magnesium intake. 6. Regular monitoring of the blood pressure. 7. Reduce sodium intake to less than 100 mmol/day (less than 2.3 gm of sodium or less than 6 gm of sodium choride)   General Counseling: I have discussed the findings of the evaluation and examination with Onalee Hua.  I have also discussed any further diagnostic evaluation thatmay be needed or ordered today. Onalee Hua verbalizes understanding of the findings of todays visit. We also reviewed his medications today and discussed drug interactions and side effects including but not limited excessive drowsiness and altered mental states. We also discussed that there is always a risk not just to him but also people around him. he has been encouraged to call the office with any questions or concerns that should arise related to todays visit.  No orders of the defined types were placed in this encounter.       I have personally obtained a history, examined the patient, evaluated laboratory and imaging results, formulated the assessment and plan and placed orders.   Valentino Hue Sol Blazing, PhD, FAASM  Diplomate, American Board of Sleep Medicine    Yevonne Pax, MD [facility] Diplomate ABMS Pulmonary and Critical Care Medicine Sleep medicine

## 2020-10-27 DIAGNOSIS — G4733 Obstructive sleep apnea (adult) (pediatric): Secondary | ICD-10-CM | POA: Diagnosis not present

## 2020-11-15 DIAGNOSIS — F902 Attention-deficit hyperactivity disorder, combined type: Secondary | ICD-10-CM | POA: Diagnosis not present

## 2020-11-15 DIAGNOSIS — G4733 Obstructive sleep apnea (adult) (pediatric): Secondary | ICD-10-CM | POA: Diagnosis not present

## 2020-11-15 DIAGNOSIS — R69 Illness, unspecified: Secondary | ICD-10-CM | POA: Diagnosis not present

## 2020-11-15 DIAGNOSIS — Z79899 Other long term (current) drug therapy: Secondary | ICD-10-CM | POA: Diagnosis not present

## 2020-11-23 DIAGNOSIS — H5203 Hypermetropia, bilateral: Secondary | ICD-10-CM | POA: Diagnosis not present

## 2020-11-25 NOTE — Progress Notes (Signed)
Scheduled to complete physical 12/06/20 with Sarah Parker, FNP.  AMD 

## 2020-11-26 DIAGNOSIS — G4733 Obstructive sleep apnea (adult) (pediatric): Secondary | ICD-10-CM | POA: Diagnosis not present

## 2020-11-28 ENCOUNTER — Ambulatory Visit: Payer: Self-pay

## 2020-11-28 ENCOUNTER — Other Ambulatory Visit: Payer: Self-pay

## 2020-11-28 DIAGNOSIS — Z0000 Encounter for general adult medical examination without abnormal findings: Secondary | ICD-10-CM

## 2020-11-28 LAB — POCT URINALYSIS DIPSTICK
Bilirubin, UA: NEGATIVE
Blood, UA: NEGATIVE
Glucose, UA: NEGATIVE
Ketones, UA: NEGATIVE
Leukocytes, UA: NEGATIVE
Nitrite, UA: NEGATIVE
Protein, UA: NEGATIVE
Spec Grav, UA: >=1.03 — AB (ref 1.010–1.025)
Urobilinogen, UA: 0.2 E.U./dL
pH, UA: 5.5 (ref 5.0–8.0)

## 2020-11-29 LAB — CMP12+LP+TP+TSH+6AC+PSA+CBC…
ALT: 28 IU/L (ref 0–44)
AST: 19 IU/L (ref 0–40)
Albumin/Globulin Ratio: 1.6 (ref 1.2–2.2)
Albumin: 4.5 g/dL (ref 4.0–5.0)
Alkaline Phosphatase: 98 IU/L (ref 44–121)
BUN/Creatinine Ratio: 14 (ref 9–20)
BUN: 14 mg/dL (ref 6–24)
Basophils Absolute: 0.1 10*3/uL (ref 0.0–0.2)
Basos: 1 %
Bilirubin Total: 0.6 mg/dL (ref 0.0–1.2)
Calcium: 9.3 mg/dL (ref 8.7–10.2)
Chloride: 104 mmol/L (ref 96–106)
Chol/HDL Ratio: 4.7 ratio (ref 0.0–5.0)
Cholesterol, Total: 145 mg/dL (ref 100–199)
Creatinine, Ser: 1.02 mg/dL (ref 0.76–1.27)
EOS (ABSOLUTE): 0.1 10*3/uL (ref 0.0–0.4)
Eos: 2 %
Estimated CHD Risk: 0.9 times avg. (ref 0.0–1.0)
Free Thyroxine Index: 1.9 (ref 1.2–4.9)
GGT: 45 IU/L (ref 0–65)
Globulin, Total: 2.9 g/dL (ref 1.5–4.5)
Glucose: 96 mg/dL (ref 65–99)
HDL: 31 mg/dL — ABNORMAL LOW (ref >39)
Hematocrit: 45.8 % (ref 37.5–51.0)
Hemoglobin: 15.6 g/dL (ref 13.0–17.7)
Immature Grans (Abs): 0 10*3/uL (ref 0.0–0.1)
Immature Granulocytes: 0 %
Iron: 100 ug/dL (ref 38–169)
LDH: 189 IU/L (ref 121–224)
LDL Chol Calc (NIH): 70 mg/dL (ref 0–99)
Lymphocytes Absolute: 2.2 10*3/uL (ref 0.7–3.1)
Lymphs: 34 %
MCH: 30.6 pg (ref 26.6–33.0)
MCHC: 34.1 g/dL (ref 31.5–35.7)
MCV: 90 fL (ref 79–97)
Monocytes Absolute: 0.5 10*3/uL (ref 0.1–0.9)
Monocytes: 9 %
Neutrophils Absolute: 3.4 10*3/uL (ref 1.4–7.0)
Neutrophils: 54 %
Phosphorus: 3.8 mg/dL (ref 2.8–4.1)
Platelets: 274 10*3/uL (ref 150–450)
Potassium: 4.3 mmol/L (ref 3.5–5.2)
Prostate Specific Ag, Serum: 0.7 ng/mL (ref 0.0–4.0)
RBC: 5.1 x10E6/uL (ref 4.14–5.80)
RDW: 12.1 % (ref 11.6–15.4)
Sodium: 143 mmol/L (ref 134–144)
T3 Uptake Ratio: 25 % (ref 24–39)
T4, Total: 7.7 ug/dL (ref 4.5–12.0)
TSH: 1.7 u[IU]/mL (ref 0.450–4.500)
Total Protein: 7.4 g/dL (ref 6.0–8.5)
Triglycerides: 275 mg/dL — ABNORMAL HIGH (ref 0–149)
Uric Acid: 6.6 mg/dL (ref 3.8–8.4)
VLDL Cholesterol Cal: 44 mg/dL — ABNORMAL HIGH (ref 5–40)
WBC: 6.3 10*3/uL (ref 3.4–10.8)
eGFR: 94 mL/min/{1.73_m2} (ref >59)

## 2020-12-01 ENCOUNTER — Other Ambulatory Visit: Payer: Self-pay | Admitting: Emergency Medicine

## 2020-12-01 DIAGNOSIS — N529 Male erectile dysfunction, unspecified: Secondary | ICD-10-CM

## 2020-12-06 ENCOUNTER — Other Ambulatory Visit: Payer: Self-pay

## 2020-12-06 ENCOUNTER — Ambulatory Visit: Payer: Self-pay | Admitting: Nurse Practitioner

## 2020-12-06 VITALS — BP 142/93 | HR 95 | Temp 97.6°F | Resp 14 | Ht 70.0 in | Wt 311.0 lb

## 2020-12-06 DIAGNOSIS — Z0000 Encounter for general adult medical examination without abnormal findings: Secondary | ICD-10-CM

## 2020-12-06 NOTE — Progress Notes (Signed)
Pt presents today to complete annual physical with Kathie Dike. CL,RMA

## 2020-12-06 NOTE — Progress Notes (Signed)
Subjective:    Patient ID: Kirk Smith, male    DOB: [birth year], 45 y.o.   MRN: [id number]  HPI  45 year old male presenting to [place] for annual physical.  Works for [employer].  Has a significant family history for Colon CA in Uncle and Heart Disease in both parents, mother deceased (MI) father has CHF.   Had colonoscopy in 2019 at [facility]- repeat 5 years   Had Cardiology work up and was followed by  Dr. [name] Situ recommendation is to increase Metoprolol to 31m twice daily if BP remains elevated- currently on 221m Last visit was 09/10/2018 with notes to f/u prn.   Was on Fenofibrate in the past for Triglyceride control, patient states his numbers go "too low" on that and was told to stop.   Was diagnosed with ADHD as an adult, has been on MyMokaneor 3 months now.  Sees Pulmonology for OSA most recent visit 09/26/20 Dr. KhHumphrey Rollsn CPAP  Bilateral plantar fasciitis repair [facility] 2021, now wears orthotics for control pain is much improved since surgery.   Has seen Rheumatology in the past for trigger finger (left pointer)  s/p carpal tunnel release bilaterally   Current Outpatient Medications  Medication Instructions  . atorvastatin (LIPITOR) 40 MG tablet TAKE 1 TABLET BY MOUTH DAILY.  . Marland Kitchenoenzyme Q10 10 MG capsule Oral  . esomeprazole (NEXIUM) 20 mg, Oral, Daily  . meloxicam (MOBIC) 15 mg, Oral, Daily, Take half a tablet  . methocarbamol (ROBAXIN) 750 MG tablet 1/2-1 po bid prn  . metoprolol tartrate (LOPRESSOR) 25 mg, Oral, 2 times daily  . MYDAYIS 50 MG CP24 1 capsule, Oral, Every morning  . Omega-3 Fatty Acids (FISH OIL) 1000 MG CAPS 1 capsule, Oral, Daily  . sildenafil (REVATIO) 20 MG tablet TAKE 2 OR 3 TABLETS BY MOUTH AS DIRECTEDFOR PLANNED ACTIVITIES     Past Medical History:  Diagnosis Date  . Acute medial meniscus tear   . Bulging lumbar disc   . CAD (coronary artery disease)   . Diastolic dysfunction    a. 09/2010 Echo: EF 55-60%, no rwma, mild LVH,  gr1 DD. Mildly dil LA.  . Marland KitchenLD (hyperlipidemia)   . HTN (hypertension)   . Kidney stone   . Morbid obesity ([place]  . Palpitations    a. H/o tachycardia.  . Sessile colonic polyp 2019    Today's Vitals   12/06/20 1035  BP: (!) 142/93  Pulse: 95  Resp: 14  Temp: 97.6 F (36.4 C)  SpO2: 97%  Weight: (!) 311 lb (141.1 kg)  Height: _0  (1.778 m)   Body mass index is 44.62 kg/m.    Review of Systems  Constitutional: Negative.   HENT: Negative.   Respiratory: Negative.   Cardiovascular:       HTN  Gastrointestinal:       GERD  Genitourinary: Negative.   Musculoskeletal: Negative.   Neurological: Negative.   Psychiatric/Behavioral:       ADHD       Objective:   Physical Exam HENT:     Head: Normocephalic.     Nose: Nose normal.  Cardiovascular:     Rate and Rhythm: Normal rate and regular rhythm.     Heart sounds: Normal heart sounds.  Pulmonary:     Effort: Pulmonary effort is normal.     Breath sounds: Normal breath sounds.  Musculoskeletal:     Cervical back: Normal range of motion.  Skin:    General:  Skin is warm.  Neurological:     General: No focal deficit present.     Mental Status: He is alert.  Psychiatric:        Mood and Affect: Mood normal.    Recent Results (from the past 2160 hour(s))  CMP12+LP+TP+TSH+6AC+PSA+CBC.     Status: Abnormal   Collection Time: 11/28/20  9:21 AM  Result Value Ref Range   Glucose 96 65 - 99 mg/dL   Uric Acid 6.6 3.8 - 8.4 mg/dL    Comment:            Therapeutic target for gout patients: <6.0   BUN 14 6 - 24 mg/dL   Creatinine, Ser 1.02 0.76 - 1.27 mg/dL   eGFR 94 >59 mL/min/1.73   BUN/Creatinine Ratio 14 9 - 20   Sodium 143 134 - 144 mmol/L   Potassium 4.3 3.5 - 5.2 mmol/L   Chloride 104 96 - 106 mmol/L   Calcium 9.3 8.7 - 10.2 mg/dL   Phosphorus 3.8 2.8 - 4.1 mg/dL   Total Protein 7.4 6.0 - 8.5 g/dL   Albumin 4.5 4.0 - 5.0 g/dL   Globulin, Total 2.9 1.5 - 4.5 g/dL   Albumin/Globulin Ratio 1.6 1.2 -  2.2   Bilirubin Total 0.6 0.0 - 1.2 mg/dL   Alkaline Phosphatase 98 44 - 121 IU/L   LDH 189 121 - 224 IU/L   AST 19 0 - 40 IU/L   ALT 28 0 - 44 IU/L   GGT 45 0 - 65 IU/L   Iron 100 38 - 169 ug/dL   Cholesterol, Total 145 100 - 199 mg/dL   Triglycerides 275 (H) 0 - 149 mg/dL   HDL 31 (L) >39 mg/dL   VLDL Cholesterol Cal 44 (H) 5 - 40 mg/dL   LDL Chol Calc (NIH) 70 0 - 99 mg/dL   Chol/HDL Ratio 4.7 0.0 - 5.0 ratio    Comment:                                   T. Chol/HDL Ratio                                             Men  Women                               1/2 Avg.Risk  3.4    3.3                                   Avg.Risk  5.0    4.4                                2X Avg.Risk  9.6    7.1                                3X Avg.Risk 23.4   11.0    Estimated CHD Risk 0.9 0.0 - 1.0 times avg.    Comment: The CHD Risk is based on the T. Chol/HDL ratio. Other factors affect CHD Risk  such as hypertension, smoking, diabetes, severe obesity, and family history of premature CHD.    TSH 1.700 0.450 - 4.500 uIU/mL   T4, Total 7.7 4.5 - 12.0 ug/dL   T3 Uptake Ratio 25 24 - 39 %   Free Thyroxine Index 1.9 1.2 - 4.9   Prostate Specific Ag, Serum 0.7 0.0 - 4.0 ng/mL    Comment: Roche ECLIA methodology. According to the American Urological Association, Serum PSA should decrease and remain at undetectable levels after radical prostatectomy. The AUA defines biochemical recurrence as an initial PSA value 0.2 ng/mL or greater followed by a subsequent confirmatory PSA value 0.2 ng/mL or greater. Values obtained with different assay methods or kits cannot be used interchangeably. Results cannot be interpreted as absolute evidence of the presence or absence of malignant disease.    WBC 6.3 3.4 - 10.8 x10E3/uL   RBC 5.10 4.14 - 5.80 x10E6/uL   Hemoglobin 15.6 13.0 - 17.7 g/dL   Hematocrit 45.8 37.5 - 51.0 %   MCV 90 79 - 97 fL   MCH 30.6 26.6 - 33.0 pg   MCHC 34.1 31.5 - 35.7 g/dL   RDW  12.1 11.6 - 15.4 %   Platelets 274 150 - 450 x10E3/uL   Neutrophils 54 Not Estab. %   Lymphs 34 Not Estab. %   Monocytes 9 Not Estab. %   Eos 2 Not Estab. %   Basos 1 Not Estab. %   Neutrophils Absolute 3.4 1.4 - 7.0 x10E3/uL   Lymphocytes Absolute 2.2 0.7 - 3.1 x10E3/uL   Monocytes Absolute 0.5 0.1 - 0.9 x10E3/uL   EOS (ABSOLUTE) 0.1 0.0 - 0.4 x10E3/uL   Basophils Absolute 0.1 0.0 - 0.2 x10E3/uL   Immature Granulocytes 0 Not Estab. %   Immature Grans (Abs) 0.0 0.0 - 0.1 x10E3/uL  POCT urinalysis dipstick     Status: Abnormal   Collection Time: 11/28/20  9:56 AM  Result Value Ref Range   Color, UA Yellow    Clarity, UA Clear    Glucose, UA Negative Negative   Bilirubin, UA Negative    Ketones, UA Negative    Spec Grav, UA >=1.030 (A) 1.010 - 1.025   Blood, UA Negative    pH, UA 5.5 5.0 - 8.0   Protein, UA Negative Negative   Urobilinogen, UA 0.2 0.2 or 1.0 E.U./dL   Nitrite, UA Negative    Leukocytes, UA Negative Negative   Appearance     Odor      Reviewed lab results with patient      Assessment & Plan:  1. Patient will record BPs at home, and will return to clinic for 3 BP checks over the next week.  If BP remains elevated Cardiology recommendation is to increase Metoprolol to 50 mg as noted in most recent note.   2. Continue statin, consider restarting fenofibrate if triglycerides remain elevated at next lipid check in 6 months.   3. Colonoscopy will be due in 2024  4. Discussed lifestyle changes, patient is motivated and will continue to make efforts to decrease cholesterol in diet and to increase exercise. Handout on cholesterol in foods given.

## 2020-12-06 NOTE — Patient Instructions (Signed)

## 2020-12-24 ENCOUNTER — Other Ambulatory Visit: Payer: Self-pay | Admitting: Physician Assistant

## 2020-12-24 DIAGNOSIS — E781 Pure hyperglyceridemia: Secondary | ICD-10-CM

## 2020-12-24 DIAGNOSIS — K219 Gastro-esophageal reflux disease without esophagitis: Secondary | ICD-10-CM

## 2020-12-27 DIAGNOSIS — G4733 Obstructive sleep apnea (adult) (pediatric): Secondary | ICD-10-CM | POA: Diagnosis not present

## 2021-01-02 ENCOUNTER — Telehealth: Payer: Self-pay

## 2021-01-02 MED ORDER — ESOMEPRAZOLE MAGNESIUM 20 MG PO CPDR: 20.0000 mg | DELAYED_RELEASE_CAPSULE | Freq: Every day | ORAL | 3 refills | Status: DC

## 2021-01-02 NOTE — Telephone Encounter (Signed)
Rx refill request already in Epic - refill request sent electronically by patient's pharmacy. Re-routed refill requests to A. [place], NPC.  AMD

## 2021-01-08 DIAGNOSIS — R21 Rash and other nonspecific skin eruption: Secondary | ICD-10-CM | POA: Diagnosis not present

## 2021-01-21 NOTE — Progress Notes (Signed)
[facility] [place], [place] [address]  Pulmonary Sleep Medicine   Office Visit Note  Patient Name: Kirk Smith DOB: 1975/09/01 MRN [id number]    Chief Complaint: Obstructive Sleep Apnea visit  Brief History:  Ina is seen today for follow up consult on "afir compliance" with patient stating 09/2020 consult where he reported his sleep time is restricted due to his work schedule and when he sleeps, he sleeps hard.  He does not feel rested when he wakes.  The patient has a 1 year history of sleep apnea. Patient is using PAP nightly.  The patient feels better after sleeping with PAP.  The patient reports benefiting from PAP use. Reported sleepiness is improved and the Epworth Sleepiness Score is 9 out of 24. The patient does take naps. The patient complains of the following: dry mouth. Recommended increase,  The compliance download shows  compliance with an average use time of 4:54 hrs @ 75% hours. The AHI is 0.9  The patient does not complain  of limb movements disrupting sleep.  ROS  General: (-) fever, (-) chills, (-) night sweat Nose and Sinuses: (-) nasal stuffiness or itchiness, (-) postnasal drip, (-) nosebleeds, (-) sinus trouble. Mouth and Throat: (-) sore throat, (-) hoarseness. Neck: (-) swollen glands, (-) enlarged thyroid, (-) neck pain. Respiratory: - cough, - shortness of breath, - wheezing. Neurologic: - numbness, - tingling. Psychiatric: - anxiety, - depression   Current Medication: Outpatient Encounter Medications as of 01/23/2021  Medication Sig   atorvastatin (LIPITOR) 40 MG tablet Take 1 tablet (40 mg total) by mouth daily.   Coenzyme Q10 10 MG capsule Take by mouth.   esomeprazole (NEXIUM) 20 MG capsule Take 1 capsule (20 mg total) by mouth daily.   meloxicam (MOBIC) 15 MG tablet Take 15 mg by mouth daily. Take half a tablet   methocarbamol (ROBAXIN) 750 MG tablet 1/2-1 po bid prn   metoprolol tartrate (LOPRESSOR) 25 MG tablet Take  1 tablet (25 mg total) by mouth 2 (two) times daily.   MYDAYIS 50 MG CP24 Take 1 capsule by mouth every morning.   Omega-3 Fatty Acids (FISH OIL) 1000 MG CAPS Take 1 capsule by mouth daily.   sildenafil (REVATIO) 20 MG tablet TAKE 2 OR 3 TABLETS BY MOUTH AS DIRECTEDFOR PLANNED ACTIVITIES   No facility-administered encounter medications on file as of 01/23/2021.    Surgical History: Past Surgical History:  Procedure Laterality Date   CARPAL TUNNEL RELEASE Bilateral    COLONOSCOPY     KNEE ARTHROSCOPY Right 09/08/2015   Procedure: ARTHROSCOPY KNEE, partial lateral menisectomy, removal plica, partial synovectomy.;  Surgeon: Thornton Park, MD;  Location: [facility] ORS;  Service: Orthopedics;  Laterality: Right;   LITHOTRIPSY     MENISCUS REPAIR     TONSILLECTOMY      Medical History: Past Medical History:  Diagnosis Date   Acute medial meniscus tear    Bulging lumbar disc    CAD (coronary artery disease)    Diastolic dysfunction    a. 09/2010 Echo: EF 55-60%, no rwma, mild LVH, gr1 DD. Mildly dil LA.   HLD (hyperlipidemia)    HTN (hypertension)    Kidney stone    Morbid obesity (HCC)    Palpitations    a. H/o tachycardia.   Sessile colonic polyp 2019    Family History: Non contributory to the present illness  Social History: Social History   Socioeconomic History   Marital status: Divorced    Spouse name:  Not on file   Number of children: Not on file   Years of education: Not on file   Highest education level: Not on file  Occupational History   Not on file  Tobacco Use   Smoking status: Never   Smokeless tobacco: Former    Types: Sarina Ser    Quit date: 08/2018  Substance and Sexual Activity   Alcohol use: Yes    Alcohol/week: 1.0 standard drink    Types: 1 Standard drinks or equivalent per week    Comment: occasional    Drug use: No   Sexual activity: Not on file  Other Topics Concern   Not on file  Social History Narrative    full time, does not get regular  exercise.    Social Determinants of Health   Financial Resource Strain: Not on file  Food Insecurity: Not on file  Transportation Needs: Not on file  Physical Activity: Not on file  Stress: Not on file  Social Connections: Not on file  Intimate Partner Violence: Not on file    Vital Signs: Blood pressure (!) 136/100, pulse 96, resp. rate 16, height 5' 11"  (1.803 m), weight (!) 315 lb (142.9 kg), SpO2 97 %.  Examination: General Appearance: The patient is well-developed, well-nourished, and in no distress. Neck Circumference: 47 Skin: Gross inspection of skin unremarkable. Head: normocephalic, no gross deformities. Eyes: no gross deformities noted. ENT: ears appear grossly normal Neurologic: Alert and oriented. No involuntary movements.    EPWORTH SLEEPINESS SCALE:  Scale:  (0)= no chance of dozing; (1)= slight chance of dozing; (2)= moderate chance of dozing; (3)= high chance of dozing  Chance  Situtation    Sitting and reading: 2    Watching TV: 1    Sitting Inactive in public: 1    As a passenger in car: 1      Lying down to rest: 1    Sitting and talking: 1    Sitting quielty after lunch: 2    In a car, stopped in traffic: 0   TOTAL SCORE:   9 out of 24    SLEEP STUDIES:  HST  04/05/20  -  RDI  29.5,  lowest saturation 79%   CPAP COMPLIANCE DATA:  Date Range: 09/23/20-01/20/21  Average Daily Use: 4:54 hours  Median Use: 5:43hours  Compliance for > 4 Hours: 75%  AHI: 0.9 respiratory events per hour  Days Used: 103/120 days  Mask Leak: 22.7 lpm  95th Percentile Pressure: 11.3 cmH2O         LABS: Recent Results (from the past 2160 hour(s))  CMP12+LP+TP+TSH+6AC+PSA+CBC.     Status: Abnormal   Collection Time: 11/28/20  9:21 AM  Result Value Ref Range   Glucose 96 65 - 99 mg/dL   Uric Acid 6.6 3.8 - 8.4 mg/dL    Comment:            Therapeutic target for gout patients: <6.0   BUN 14 6 - 24 mg/dL   Creatinine, Ser 1.02 0.76 -  1.27 mg/dL   eGFR 94 >59 mL/min/1.73   BUN/Creatinine Ratio 14 9 - 20   Sodium 143 134 - 144 mmol/L   Potassium 4.3 3.5 - 5.2 mmol/L   Chloride 104 96 - 106 mmol/L   Calcium 9.3 8.7 - 10.2 mg/dL   Phosphorus 3.8 2.8 - 4.1 mg/dL   Total Protein 7.4 6.0 - 8.5 g/dL   Albumin 4.5 4.0 - 5.0 g/dL   Globulin, Total 2.9 1.5 - 4.5  g/dL   Albumin/Globulin Ratio 1.6 1.2 - 2.2   Bilirubin Total 0.6 0.0 - 1.2 mg/dL   Alkaline Phosphatase 98 44 - 121 IU/L   LDH 189 121 - 224 IU/L   AST 19 0 - 40 IU/L   ALT 28 0 - 44 IU/L   GGT 45 0 - 65 IU/L   Iron 100 38 - 169 ug/dL   Cholesterol, Total 145 100 - 199 mg/dL   Triglycerides 275 (H) 0 - 149 mg/dL   HDL 31 (L) >39 mg/dL   VLDL Cholesterol Cal 44 (H) 5 - 40 mg/dL   LDL Chol Calc (NIH) 70 0 - 99 mg/dL   Chol/HDL Ratio 4.7 0.0 - 5.0 ratio    Comment:                                   T. Chol/HDL Ratio                                             Men  Women                               1/2 Avg.Risk  3.4    3.3                                   Avg.Risk  5.0    4.4                                2X Avg.Risk  9.6    7.1                                3X Avg.Risk 23.4   11.0    Estimated CHD Risk 0.9 0.0 - 1.0 times avg.    Comment: The CHD Risk is based on the T. Chol/HDL ratio. Other factors affect CHD Risk such as hypertension, smoking, diabetes, severe obesity, and family history of premature CHD.    TSH 1.700 0.450 - 4.500 uIU/mL   T4, Total 7.7 4.5 - 12.0 ug/dL   T3 Uptake Ratio 25 24 - 39 %   Free Thyroxine Index 1.9 1.2 - 4.9   Prostate Specific Ag, Serum 0.7 0.0 - 4.0 ng/mL    Comment: Roche ECLIA methodology. According to the American Urological Association, Serum PSA should decrease and remain at undetectable levels after radical prostatectomy. The AUA defines biochemical recurrence as an initial PSA value 0.2 ng/mL or greater followed by a subsequent confirmatory PSA value 0.2 ng/mL or greater. Values obtained with different  assay methods or kits cannot be used interchangeably. Results cannot be interpreted as absolute evidence of the presence or absence of malignant disease.    WBC 6.3 3.4 - 10.8 x10E3/uL   RBC 5.10 4.14 - 5.80 x10E6/uL   Hemoglobin 15.6 13.0 - 17.7 g/dL   Hematocrit 45.8 37.5 - 51.0 %   MCV 90 79 - 97 fL   MCH 30.6 26.6 - 33.0 pg   MCHC 34.1 31.5 - 35.7 g/dL   RDW 12.1 11.6 - 15.4 %  Platelets 274 150 - 450 x10E3/uL   Neutrophils 54 Not Estab. %   Lymphs 34 Not Estab. %   Monocytes 9 Not Estab. %   Eos 2 Not Estab. %   Basos 1 Not Estab. %   Neutrophils Absolute 3.4 1.4 - 7.0 x10E3/uL   Lymphocytes Absolute 2.2 0.7 - 3.1 x10E3/uL   Monocytes Absolute 0.5 0.1 - 0.9 x10E3/uL   EOS (ABSOLUTE) 0.1 0.0 - 0.4 x10E3/uL   Basophils Absolute 0.1 0.0 - 0.2 x10E3/uL   Immature Granulocytes 0 Not Estab. %   Immature Grans (Abs) 0.0 0.0 - 0.1 x10E3/uL  POCT urinalysis dipstick     Status: Abnormal   Collection Time: 11/28/20  9:56 AM  Result Value Ref Range   Color, UA Yellow    Clarity, UA Clear    Glucose, UA Negative Negative   Bilirubin, UA Negative    Ketones, UA Negative    Spec Grav, UA >=1.030 (A) 1.010 - 1.025   Blood, UA Negative    pH, UA 5.5 5.0 - 8.0   Protein, UA Negative Negative   Urobilinogen, UA 0.2 0.2 or 1.0 E.U./dL   Nitrite, UA Negative    Leukocytes, UA Negative Negative   Appearance     Odor      Radiology: [place] RT UPPER EXTREM LTD SOFT TISSUE NON VASCULAR  Result Date: 04/03/2018 CLINICAL DATA:  Trigger finger of the third right digit EXAM: ULTRASOUND RIGHT UPPER EXTREMITY LIMITED TECHNIQUE: Ultrasound examination of the upper extremity soft tissues was performed in the area of clinical concern. COMPARISON:  None FINDINGS: Real-time sonography of the right third digit was performed with a high-frequency linear transducer. The flexor tendon is intact and normal in echogenicity. There is tenosynovial thickening at the level of the third MCP joint with thickening  of the A1 pulley. There is mild thickening of the A1 pulley of first MCP joint. There is no abnormal Doppler flow. IMPRESSION: 1. Tenosynovial thickening involving the third flexor digitorum tendon sheath at the level of the third MCP joint with thickening of the A1 pulley. 2. Mild thickening of the A1 pulley of the first MCP joint. Electronically Signed   By: Kathreen Devoid   On: 04/03/2018 17:08    No results found.  No results found.    Assessment and Plan: Patient Active Problem List   Diagnosis Date Noted   OSA on CPAP 09/26/2020   Morbid obesity ([place]) 09/26/2020   OSA (obstructive sleep apnea) 05/30/2020   CPAP use counseling 05/30/2020   Acquired trigger finger 12/21/2019   Carpal tunnel syndrome 12/21/2019   Erectile dysfunction 01/22/2019   History of arthroscopic knee surgery 01/22/2019   Acute lateral meniscus tear of right knee 12/18/2018   Encounter for long-term (current) use of high-risk medication 10/08/2018   Stiffness of joints of both hands 09/25/2018   Swelling of finger joint of left hand 09/25/2018   Trigger thumb of right hand 08/20/2017   Trigger finger of right hand 03/20/2017   Family history of gout 09/15/2015   Family history of psoriasis 09/15/2015   Finger pain, left 09/15/2015   Hip joint inflamed 09/01/2015   Rotator cuff tear 09/23/2014   DDD (degenerative disc disease), lumbar 03/23/2014   Lumbar paraspinal muscle spasm 03/23/2014   Hyperlipidemia 08/16/2010   Obesity 08/16/2010   Essential hypertension 08/16/2010   Tachycardia 08/16/2010   SHORTNESS OF BREATH 08/16/2010   1. OSA on CPAP The patient does tolerate PAP and reports definite benefit from PAP  use. The patient was reminded how to clean equipment and advised to replace supplies routinely. The patient was also counselled on weight loss. The compliance is good. The AHI is 0.9.   OSA- continue with good compliance, increase hours of sleep if possible. F/u on year.    2. CPAP use  counseling CPAP Counseling: had a lengthy discussion with the patient regarding the importance of PAP therapy in management of the sleep apnea. Patient appears to understand the risk factor reduction and also understands the risks associated with untreated sleep apnea. Patient will try to make a good faith effort to remain compliant with therapy. Also instructed the patient on proper cleaning of the device including the water must be changed daily if possible and use of distilled water is preferred. Patient understands that the machine should be regularly cleaned with appropriate recommended cleaning solutions that do not damage the PAP machine for example given white vinegar and water rinses. Other methods such as ozone treatment may not be as good as these simple methods to achieve cleaning.   3. Morbid obesity ([place]) Obesity Counseling: Had a lengthy discussion regarding patients BMI and weight issues. Patient was instructed on portion control as well as increased activity. Also discussed caloric restrictions with trying to maintain intake less than 2000 Kcal. Discussions were made in accordance with the 5As of weight management. Simple actions such as not eating late and if able to, taking a walk is suggested.   4. Essential hypertension Hypertension Counseling:   The following hypertensive lifestyle modification were recommended and discussed:  1. Limiting alcohol intake to less than 1 oz/day of ethanol:(24 oz of beer or 8 oz of wine or 2 oz of 100-proof whiskey). 2. Take baby ASA 81 mg daily. 3. Importance of regular aerobic exercise and losing weight. 4. Reduce dietary saturated fat and cholesterol intake for overall cardiovascular health. 5. Maintaining adequate dietary potassium, calcium, and magnesium intake. 6. Regular monitoring of the blood pressure. 7. Reduce sodium intake to less than 100 mmol/day (less than 2.3 gm of sodium or less than 6 gm of sodium choride)      General  Counseling: I have discussed the findings of the evaluation and examination with Shanon Brow.  I have also discussed any further diagnostic evaluation thatmay be needed or ordered today. Shanon Brow verbalizes understanding of the findings of todays visit. We also reviewed his medications today and discussed drug interactions and side effects including but not limited excessive drowsiness and altered mental states. We also discussed that there is always a risk not just to him but also people around him. he has been encouraged to call the office with any questions or concerns that should arise related to todays visit.  No orders of the defined types were placed in this encounter.       I have personally obtained a history, examined the patient, evaluated laboratory and imaging results, formulated the assessment and plan and placed orders.   This patient was seen today by Tressie Ellis, PA-C in collaboration with Dr. Devona Konig.    Allyne Gee, MD [facility] Diplomate ABMS Pulmonary and Critical Care Medicine Sleep medicine

## 2021-01-23 ENCOUNTER — Ambulatory Visit (INDEPENDENT_AMBULATORY_CARE_PROVIDER_SITE_OTHER): Payer: 59 | Admitting: Internal Medicine

## 2021-01-23 VITALS — BP 136/100 | HR 96 | Resp 16 | Ht 71.0 in | Wt 315.0 lb

## 2021-01-23 DIAGNOSIS — I10 Essential (primary) hypertension: Secondary | ICD-10-CM

## 2021-01-23 DIAGNOSIS — G4733 Obstructive sleep apnea (adult) (pediatric): Secondary | ICD-10-CM

## 2021-01-23 DIAGNOSIS — Z9989 Dependence on other enabling machines and devices: Secondary | ICD-10-CM

## 2021-01-23 DIAGNOSIS — Z7189 Other specified counseling: Secondary | ICD-10-CM

## 2021-01-23 NOTE — Patient Instructions (Signed)

## 2021-01-26 DIAGNOSIS — G4733 Obstructive sleep apnea (adult) (pediatric): Secondary | ICD-10-CM | POA: Diagnosis not present

## 2021-02-16 ENCOUNTER — Other Ambulatory Visit: Payer: Self-pay

## 2021-02-16 DIAGNOSIS — I10 Essential (primary) hypertension: Secondary | ICD-10-CM

## 2021-02-16 DIAGNOSIS — E781 Pure hyperglyceridemia: Secondary | ICD-10-CM

## 2021-02-17 MED ORDER — METOPROLOL TARTRATE 25 MG PO TABS: 25.0000 mg | ORAL_TABLET | Freq: Two times a day (BID) | ORAL | 3 refills | Status: DC

## 2021-03-07 DIAGNOSIS — F902 Attention-deficit hyperactivity disorder, combined type: Secondary | ICD-10-CM | POA: Diagnosis not present

## 2021-03-07 DIAGNOSIS — Z79899 Other long term (current) drug therapy: Secondary | ICD-10-CM | POA: Diagnosis not present

## 2021-03-07 DIAGNOSIS — R69 Illness, unspecified: Secondary | ICD-10-CM | POA: Diagnosis not present

## 2021-06-06 DIAGNOSIS — R69 Illness, unspecified: Secondary | ICD-10-CM | POA: Diagnosis not present

## 2021-06-06 DIAGNOSIS — F902 Attention-deficit hyperactivity disorder, combined type: Secondary | ICD-10-CM | POA: Diagnosis not present

## 2021-06-06 DIAGNOSIS — Z79899 Other long term (current) drug therapy: Secondary | ICD-10-CM | POA: Diagnosis not present

## 2021-07-13 ENCOUNTER — Other Ambulatory Visit: Payer: Self-pay

## 2021-07-13 DIAGNOSIS — Z1152 Encounter for screening for COVID-19: Secondary | ICD-10-CM

## 2021-07-13 NOTE — Progress Notes (Signed)
States started feeling a little bad last night. Woke up feeling worse. Rapid Covid test = Negative No know exposure to someone whose recently tested positive for covid.  S/Sx: Sinus drainage, scratchy throat & slight cough  States taking Mucinex D & felt better within an hour of taking. Has been off Zyrtec x3 weeks  AMD

## 2021-07-14 LAB — SARS-COV-2, NAA 2 DAY TAT

## 2021-07-14 LAB — NOVEL CORONAVIRUS, NAA: SARS-CoV-2, NAA: NOT DETECTED

## 2021-07-18 ENCOUNTER — Ambulatory Visit: Payer: 59 | Admitting: Physician Assistant

## 2021-07-18 ENCOUNTER — Other Ambulatory Visit: Payer: Self-pay

## 2021-07-18 ENCOUNTER — Encounter: Payer: Self-pay | Admitting: Physician Assistant

## 2021-07-18 DIAGNOSIS — U071 COVID-19: Secondary | ICD-10-CM

## 2021-07-18 DIAGNOSIS — Z1152 Encounter for screening for COVID-19: Secondary | ICD-10-CM

## 2021-07-18 LAB — POC COVID19 BINAXNOW: SARS Coronavirus 2 Ag: POSITIVE — AB

## 2021-07-18 MED ORDER — HYDROCOD POLST-CPM POLST ER 10-8 MG/5ML PO SUER: 5.0000 mL | Freq: Two times a day (BID) | ORAL | 0 refills | Status: DC

## 2021-07-18 MED ORDER — NIRMATRELVIR/RITONAVIR (PAXLOVID)TABLET: 3.0000 | ORAL_TABLET | Freq: Two times a day (BID) | ORAL | 0 refills | Status: AC

## 2021-07-18 NOTE — Progress Notes (Signed)
° °  Subjective: Viral illness    Patient ID: Kirk Smith, male    DOB: 1975-12-10, 46 y.o.   MRN: [id number]  HPI Patient complain of cough and chest congestion for 9 days.  Patient has not taken the COVID-19 vaccine or influenza this season.  Patient tested negative for COVID-19 on 07/14/2021 but tested positive today.   Review of Systems Erectile dysfunction, GERD, hyperlipidemia, and hypertension.    Objective:   Physical Exam  This is a virtual visit      Assessment & Plan: COVID-19   Patient given a prescription for Paxlovid and Tussionex.  Patient advised to self quarantine per CDC recommendations.  Follow-up telephonically no improvement or condition worsens.

## 2021-07-18 NOTE — Progress Notes (Signed)
cough, chest congestion x9 days Covid test was neg. 07-13-21 Today pt tested positive with rapid covid.

## 2021-07-18 NOTE — Progress Notes (Signed)
Pt presents today for rapid covid test for on going symptoms.  cough, chest congestion x9 days Covid test was neg. 07-13-21  Pt tested positive today.

## 2021-09-05 DIAGNOSIS — R69 Illness, unspecified: Secondary | ICD-10-CM | POA: Diagnosis not present

## 2021-09-05 DIAGNOSIS — Z79899 Other long term (current) drug therapy: Secondary | ICD-10-CM | POA: Diagnosis not present

## 2021-09-05 DIAGNOSIS — G4733 Obstructive sleep apnea (adult) (pediatric): Secondary | ICD-10-CM | POA: Diagnosis not present

## 2021-09-05 DIAGNOSIS — F902 Attention-deficit hyperactivity disorder, combined type: Secondary | ICD-10-CM | POA: Diagnosis not present

## 2021-09-11 DIAGNOSIS — G4733 Obstructive sleep apnea (adult) (pediatric): Secondary | ICD-10-CM | POA: Diagnosis not present

## 2021-09-26 ENCOUNTER — Other Ambulatory Visit: Payer: Self-pay | Admitting: Adult Health

## 2021-09-26 ENCOUNTER — Other Ambulatory Visit: Payer: Self-pay

## 2021-09-26 DIAGNOSIS — K219 Gastro-esophageal reflux disease without esophagitis: Secondary | ICD-10-CM

## 2021-09-27 MED ORDER — ESOMEPRAZOLE MAGNESIUM 20 MG PO CPDR: 20.0000 mg | DELAYED_RELEASE_CAPSULE | Freq: Every day | ORAL | 3 refills | Status: DC

## 2021-10-06 ENCOUNTER — Other Ambulatory Visit: Payer: Self-pay | Admitting: Physician Assistant

## 2021-11-21 ENCOUNTER — Ambulatory Visit: Payer: Self-pay

## 2021-11-21 DIAGNOSIS — Z0000 Encounter for general adult medical examination without abnormal findings: Secondary | ICD-10-CM

## 2021-11-21 LAB — POCT URINALYSIS DIPSTICK
Bilirubin, UA: NEGATIVE
Blood, UA: NEGATIVE
Glucose, UA: NEGATIVE
Ketones, UA: NEGATIVE
Leukocytes, UA: NEGATIVE
Nitrite, UA: NEGATIVE
Protein, UA: POSITIVE — AB
Spec Grav, UA: >=1.03 — AB (ref 1.010–1.025)
Urobilinogen, UA: 0.2 E.U./dL
pH, UA: 6 (ref 5.0–8.0)

## 2021-11-23 LAB — CMP12+LP+TP+TSH+6AC+PSA+CBC…
ALT: 28 IU/L (ref 0–44)
AST: 19 IU/L (ref 0–40)
Albumin/Globulin Ratio: 1.9 (ref 1.2–2.2)
Albumin: 4.7 g/dL (ref 4.0–5.0)
Alkaline Phosphatase: 76 IU/L (ref 44–121)
BUN/Creatinine Ratio: 12 (ref 9–20)
BUN: 12 mg/dL (ref 6–24)
Basophils Absolute: 0.1 10*3/uL (ref 0.0–0.2)
Basos: 1 %
Bilirubin Total: 0.4 mg/dL (ref 0.0–1.2)
Calcium: 9.3 mg/dL (ref 8.7–10.2)
Chloride: 104 mmol/L (ref 96–106)
Chol/HDL Ratio: 4.5 ratio (ref 0.0–5.0)
Cholesterol, Total: 145 mg/dL (ref 100–199)
Creatinine, Ser: 0.98 mg/dL (ref 0.76–1.27)
EOS (ABSOLUTE): 0.1 10*3/uL (ref 0.0–0.4)
Eos: 2 %
Estimated CHD Risk: 0.9 times avg. (ref 0.0–1.0)
Free Thyroxine Index: 1.7 (ref 1.2–4.9)
GGT: 35 IU/L (ref 0–65)
Globulin, Total: 2.5 g/dL (ref 1.5–4.5)
Glucose: 101 mg/dL — ABNORMAL HIGH (ref 70–99)
HDL: 32 mg/dL — ABNORMAL LOW (ref >39)
Hematocrit: 44.8 % (ref 37.5–51.0)
Hemoglobin: 15.4 g/dL (ref 13.0–17.7)
Immature Grans (Abs): 0 10*3/uL (ref 0.0–0.1)
Immature Granulocytes: 0 %
Iron: 95 ug/dL (ref 38–169)
LDH: 145 IU/L (ref 121–224)
LDL Chol Calc (NIH): 77 mg/dL (ref 0–99)
Lymphocytes Absolute: 2.3 10*3/uL (ref 0.7–3.1)
Lymphs: 35 %
MCH: 30.5 pg (ref 26.6–33.0)
MCHC: 34.4 g/dL (ref 31.5–35.7)
MCV: 89 fL (ref 79–97)
Monocytes Absolute: 0.6 10*3/uL (ref 0.1–0.9)
Monocytes: 9 %
Neutrophils Absolute: 3.5 10*3/uL (ref 1.4–7.0)
Neutrophils: 53 %
Phosphorus: 3.5 mg/dL (ref 2.8–4.1)
Platelets: 265 10*3/uL (ref 150–450)
Potassium: 4.9 mmol/L (ref 3.5–5.2)
Prostate Specific Ag, Serum: 0.7 ng/mL (ref 0.0–4.0)
RBC: 5.05 x10E6/uL (ref 4.14–5.80)
RDW: 12.1 % (ref 11.6–15.4)
Sodium: 142 mmol/L (ref 134–144)
T3 Uptake Ratio: 24 % (ref 24–39)
T4, Total: 7 ug/dL (ref 4.5–12.0)
TSH: 1.45 u[IU]/mL (ref 0.450–4.500)
Total Protein: 7.2 g/dL (ref 6.0–8.5)
Triglycerides: 213 mg/dL — ABNORMAL HIGH (ref 0–149)
Uric Acid: 6.8 mg/dL (ref 3.8–8.4)
VLDL Cholesterol Cal: 36 mg/dL (ref 5–40)
WBC: 6.5 10*3/uL (ref 3.4–10.8)
eGFR: 98 mL/min/{1.73_m2} (ref >59)

## 2021-11-23 LAB — TESTOSTERONE,FREE AND TOTAL
Testosterone, Free: 11.2 pg/mL (ref 6.8–21.5)
Testosterone: 327 ng/dL (ref 264–916)

## 2021-11-25 ENCOUNTER — Other Ambulatory Visit: Payer: Self-pay | Admitting: Physician Assistant

## 2021-11-25 DIAGNOSIS — I10 Essential (primary) hypertension: Secondary | ICD-10-CM

## 2021-11-28 ENCOUNTER — Ambulatory Visit: Payer: Self-pay | Admitting: Physician Assistant

## 2021-11-28 ENCOUNTER — Encounter: Payer: Self-pay | Admitting: Physician Assistant

## 2021-11-28 VITALS — BP 136/96 | HR 85 | Temp 97.6°F | Resp 14 | Ht 70.0 in | Wt 307.0 lb

## 2021-11-28 DIAGNOSIS — R809 Proteinuria, unspecified: Secondary | ICD-10-CM

## 2021-11-28 DIAGNOSIS — E781 Pure hyperglyceridemia: Secondary | ICD-10-CM

## 2021-11-28 DIAGNOSIS — Z0000 Encounter for general adult medical examination without abnormal findings: Secondary | ICD-10-CM

## 2021-11-28 LAB — POCT URINALYSIS DIPSTICK
Bilirubin, UA: NEGATIVE
Blood, UA: NEGATIVE
Glucose, UA: NEGATIVE
Ketones, UA: NEGATIVE
Leukocytes, UA: NEGATIVE
Nitrite, UA: NEGATIVE
Protein, UA: POSITIVE — AB
Spec Grav, UA: >=1.03 — AB (ref 1.010–1.025)
Urobilinogen, UA: 0.2 E.U./dL
pH, UA: 6 (ref 5.0–8.0)

## 2021-11-28 NOTE — Progress Notes (Signed)
[facility]  ____________________________________________   None    (approximate)  I have reviewed the triage vital signs and the nursing notes.   HISTORY  Chief Complaint Annual Exam  {HPI Kirk Smith is a 46 y.o. male patient presents for annual physical exam.  Patient past medical history remarkable for hyperlipidemia and hypertension.         Past Medical History:  Diagnosis Date   Acute medial meniscus tear    Bulging lumbar disc    CAD (coronary artery disease)    Diastolic dysfunction    a. 09/2010 Echo: EF 55-60%, no rwma, mild LVH, gr1 DD. Mildly dil LA.   HLD (hyperlipidemia)    HTN (hypertension)    Kidney stone    Morbid obesity (HCC)    Palpitations    a. H/o tachycardia.   Sessile colonic polyp 2019    Patient Active Problem List   Diagnosis Date Noted   OSA on CPAP 09/26/2020   Morbid obesity ([place]) 09/26/2020   OSA (obstructive sleep apnea) 05/30/2020   CPAP use counseling 05/30/2020   Acquired trigger finger 12/21/2019   Carpal tunnel syndrome 12/21/2019   Erectile dysfunction 01/22/2019   History of arthroscopic knee surgery 01/22/2019   Acute lateral meniscus tear of right knee 12/18/2018   Encounter for long-term (current) use of high-risk medication 10/08/2018   Stiffness of joints of both hands 09/25/2018   Swelling of finger joint of left hand 09/25/2018   Trigger thumb of right hand 08/20/2017   Trigger finger of right hand 03/20/2017   Family history of gout 09/15/2015   Family history of psoriasis 09/15/2015   Finger pain, left 09/15/2015   Hip joint inflamed 09/01/2015   Rotator cuff tear 09/23/2014   DDD (degenerative disc disease), lumbar 03/23/2014   Lumbar paraspinal muscle spasm 03/23/2014   Hyperlipidemia 08/16/2010   Obesity 08/16/2010   Essential hypertension 08/16/2010   Tachycardia 08/16/2010   SHORTNESS OF BREATH 08/16/2010    Past Surgical History:  Procedure Laterality  Date   CARPAL TUNNEL RELEASE Bilateral    COLONOSCOPY     KNEE ARTHROSCOPY Right 09/08/2015   Procedure: ARTHROSCOPY KNEE, partial lateral menisectomy, removal plica, partial synovectomy.;  Surgeon: Thornton Park, MD;  Location: [facility] ORS;  Service: Orthopedics;  Laterality: Right;   LITHOTRIPSY     MENISCUS REPAIR     TONSILLECTOMY      Prior to Admission medications   Medication Sig Start Date End Date Taking? Authorizing Provider  atorvastatin (LIPITOR) 40 MG tablet Take 1 tablet (40 mg total) by mouth daily. 01/02/21  Yes Scarboro, Audie Clear, NP  Coenzyme Q10 10 MG capsule Take by mouth.   Yes [provider]  esomeprazole (NEXIUM) 20 MG capsule Take 1 capsule (20 mg total) by mouth daily. 09/27/21  Yes Sable Feil, PA-C  meloxicam (MOBIC) 15 MG tablet Take 1 tablet by mouth daily. 07/24/21  Yes [provider]  methocarbamol (ROBAXIN) 750 MG tablet 1/2-1 po bid prn 08/11/20  Yes [provider]  metoprolol tartrate (LOPRESSOR) 25 MG tablet Take 1 tablet (25 mg total) by mouth 2 (two) times daily. 02/17/21  Yes Sable Feil, PA-C  MYDAYIS 50 MG CP24 Take 1 capsule by mouth every morning. 05/19/20  Yes [provider]  Omega-3 Fatty Acids (FISH OIL) 1000 MG CAPS Take 1 capsule by mouth daily.   Yes [provider]  sildenafil (REVATIO) 20 MG tablet SMARTSIG:2-3 Tablet(s) By Mouth As  Directed 10/06/21  Yes [provider]    Allergies Penicillins, Phenergan [promethazine hcl], and Promethazine  Family History  Problem Relation Age of Onset   Heart failure Father        CHF   Heart disease Brother        heart problems    Social History Social History   Tobacco Use   Smoking status: Never   Smokeless tobacco: Former    Types: Chew    Quit date: 08/2018  Substance Use Topics   Alcohol use: Yes    Alcohol/week: 1.0 standard drink    Types: 1 Standard drinks or equivalent per week    Comment: occasional    Drug use: No     Review of Systems Constitutional: No fever/chills Eyes: No visual changes. ENT: No sore throat. Cardiovascular: Denies chest pain. Respiratory: Denies shortness of breath. Gastrointestinal: No abdominal pain.  No nausea, no vomiting.  No diarrhea.  No constipation. Genitourinary: Negative for dysuria. Musculoskeletal: Negative for back pain. Skin: Negative for rash. Neurological: Negative for headaches, focal weakness or numbness. Endocrine: Hyperlipidemia and hypertension Allergic/Immunilogical: Penicillin and Phenergan. ____________________________________________   PHYSICAL EXAM:  VITAL SIGNS: BP is 136/96, pulse 85, respiration 14, temp 97.6, patient 1% O2 sat on room air.  Patient weighs 307 pounds and BMI is 44.05. Constitutional: Alert and oriented. Well appearing and in no acute distress. Eyes: Conjunctivae are normal. PERRL. EOMI. Head: Atraumatic. Nose: No congestion/rhinnorhea. Mouth/Throat: Mucous membranes are moist.  Oropharynx non-erythematous. Neck: No stridor.  No cervical spine tenderness to palpation. Hematological/Lymphatic/Immunilogical: No cervical lymphadenopathy. Cardiovascular: Normal rate, regular rhythm. Grossly normal heart sounds.  Good peripheral circulation. Respiratory: Normal respiratory effort.  No retractions. Lungs CTAB. Gastrointestinal: Soft and nontender. No distention. No abdominal bruits. No CVA tenderness. Genitourinary: Deferred Musculoskeletal: No lower extremity tenderness nor edema.  No joint effusions. Neurologic:  Normal speech and language. No gross focal neurologic deficits are appreciated. No gait instability. Skin:  Skin is warm, dry and intact. No rash noted. Psychiatric: Mood and affect are normal. Speech and behavior are normal.  ____________________________________________   LABS        Component Ref Range & Units 7 d ago 1 yr ago 2 yr ago 6 yr ago  Color, UA  Amber  Yellow  yellow    Clarity, UA  Clear  Clear   clear    Glucose, UA Negative Negative  Negative  Negative    Bilirubin, UA  Negative  Negative  negative    Ketones, UA  Negative  Negative  negative    Spec Grav, UA 1.010 - 1.025 >=1.030 Abnormal   >=1.030 Abnormal   1.020    Blood, UA  Negative  Negative  negative    pH, UA 5.0 - 8.0 6.0  5.5  6.0    Protein, UA Negative Positive Abnormal   Negative  Negative    Comment: 1+  Urobilinogen, UA 0.2 or 1.0 E.U./dL 0.2  0.2  0.2    Nitrite, UA  Negative  Negative  negative    Leukocytes, UA Negative Negative  Negative  Negative  NEGATIVE R   Appearance       HAZY Abnormal  R   Odor         Resulting Agency     [facility]                  Other Results from 11/21/2021   Contains abnormal data CMP12+LP+TP+TSH+6AC+PSA+CBC. Order: [id number] Status: Final  result    Visible to patient: Yes (seen)    Next appt: None    Dx: Routine adult health maintenance    0 Result Notes            Component Ref Range & Units 7 d ago (11/21/21) 1 yr ago (11/28/20) 2 yr ago (09/16/19) 6 yr ago (09/01/15) 6 yr ago (12/06/14) 6 yr ago (12/06/14) 7 yr ago (08/12/14) 7 yr ago (08/12/14)  Glucose 70 - 99 mg/dL 101 High   96 R  86 R  102 High  R  114 High  R   101 High  R    Uric Acid 3.8 - 8.4 mg/dL 6.8  6.6 CM  6.9 CM        Comment:            Therapeutic target for gout patients: <6.0  BUN 6 - 24 mg/dL _0 R  14 R   11 R    Creatinine, Ser 0.76 - 1.27 mg/dL 0.98  1.02  0.95  0.93 R  0.97 R   1.19 R    eGFR >59 mL/min/1.73 98  94         BUN/Creatinine Ratio 9 - _1 Sodium 134 - 144 mmol/L 142  143  140  140 R  141 R   141 R    Potassium 3.5 - 5.2 mmol/L 4.9  4.3  4.7  4.2 R  4.6 R, CM   4.4 R    Chloride 96 - 106 mmol/L 104  104  100  103 R  106 R   106 R    Calcium 8.7 - 10.2 mg/dL 9.3  9.3  9.5  9.5 R  9.0 R   9.0 R    Phosphorus 2.8 - 4.1 mg/dL 3.5  3.8  3.8        Total Protein 6.0 - 8.5 g/dL 7.2  7.4  7.7  7.7 R    7.5 R    Albumin 4.0 - 5.0 g/dL 4.7  4.5  4.7  4.1  R    3.7 R    Globulin, Total 1.5 - 4.5 g/dL 2.5  2.9  3.0        Albumin/Globulin Ratio 1.2 - 2.2 1.9  1.6  1.6        Bilirubin Total 0.0 - 1.2 mg/dL 0.4  0.6  0.5  0.4 R    0.4 R    Alkaline Phosphatase 44 - 121 IU/L 76  98  89 R  72 R    89 R    LDH 121 - 224 IU/L 145  189  178        AST 0 - 40 IU/L 19  19  32  27 R    26 R    ALT 0 - 44 IU/L 28  28  43  41 R    47 R    GGT 0 - 65 IU/L 35  45  44        Iron 38 - 169 ug/dL 95  100  125        Cholesterol, Total 100 - 199 mg/dL 145  145  162        Triglycerides 0 - 149 mg/dL 213 High   275 High   344 High         HDL >39 mg/dL 32  Low   31 Low   33 Low         VLDL Cholesterol Cal 5 - 40 mg/dL 36  44 High   55 High         LDL Chol Calc (NIH) 0 - 99 mg/dL 77  70  74        Chol/HDL Ratio 0.0 - 5.0 ratio 4.5  4.7 CM  4.9 CM        Comment:                                   T. Chol/HDL Ratio                                              Men  Women                                1/2 Avg.Risk  3.4    3.3                                    Avg.Risk  5.0    4.4                                 2X Avg.Risk  9.6    7.1                                 3X Avg.Risk 23.4   11.0   Estimated CHD Risk 0.0 - 1.0 times avg. 0.9  0.9 CM  1.0 CM        Comment: The CHD Risk is based on the T. Chol/HDL ratio. Other  factors affect CHD Risk such as hypertension, smoking,  diabetes, severe obesity, and family history of  premature CHD.   TSH 0.450 - 4.500 uIU/mL 1.450  1.700  1.420        T4, Total 4.5 - 12.0 ug/dL 7.0  7.7  7.4        T3 Uptake Ratio 24 - 39 % _0 Low         Free Thyroxine Index 1.2 - 4.9 1.7  1.9  1.7        Prostate Specific Ag, Serum 0.0 - 4.0 ng/mL 0.7  0.7 CM  0.8 CM        Comment: Roche ECLIA methodology.  According to the American Urological Association, Serum PSA should  decrease and remain at undetectable levels after radical  prostatectomy. The AUA defines biochemical recurrence as an initial  PSA value 0.2 ng/mL  or greater followed by a subsequent confirmatory  PSA value 0.2 ng/mL or greater.  Values obtained with different assay methods or kits cannot be used  interchangeably. Results cannot be interpreted as absolute evidence  of the presence or absence of malignant disease.   WBC 3.4 - 10.8 x10E3/uL 6.5  6.3  7.6    10.5 R   7.8 R   RBC 4.14 - 5.80 x10E6/uL 5.05  5.10  5.22    5.04 R  5.01 R   Hemoglobin 13.0 - 17.7 g/dL 15.4  15.6  16.1    15.2 R     Hematocrit 37.5 - 51.0 % 44.8  45.8  46.8    45.1 R     MCV 79 - 97 fL 89  90  90    89.4 R   89 R   MCH 26.6 - 33.0 pg 30.5  30.6  30.8    30.1 R   29.4 R   MCHC 31.5 - 35.7 g/dL 34.4  34.1  34.4    33.7 R   33.2 R   RDW 11.6 - 15.4 % 12.1  12.1  12.2    12.8 R   12.8 R   Platelets 150 - 450 x10E3/uL 265  274  273    233 R   243 R   Neutrophils Not Estab. % 53  54  57        Lymphs Not Estab. % 35  34  34        Monocytes Not Estab. % _0 Eos Not Estab. % _1 Basos Not Estab. % _2 Neutrophils Absolute 1.4 - 7.0 x10E3/uL 3.5  3.4  4.3        Lymphocytes Absolute 0.7 - 3.1 x10E3/uL 2.3  2.2  2.6        Monocytes Absolute 0.1 - 0.9 x10E3/uL 0.6  0.5  0.6        EOS (ABSOLUTE) 0.0 - 0.4 x10E3/uL 0.1  0.1  0.1        Basophils Absolute 0.0 - 0.2 x10E3/uL 0.1  0.1  0.1        Immature Granulocytes Not Estab. % 0  0  0        Immature Grans               ____________________________________________  EKG Sinus  Rhythm 72 bpm WITHIN NORMAL LIMITS   ____________________________________________    ____________________________________________   INITIAL IMPRESSION / ASSESSMENT AND PLAN  As part of my medical decision making, I reviewed the following data within the electronic MEDICAL RECORD NUMBER      Discussed lab and EKG report.  Repeat urinalysis shows continue proteinuria.  Patient admits to drinking 2 protein drinks a day.  Coordination with all the labs shows no acute findings but will monitor with  24-hour urine.        ____________________________________________   FINAL CLINICAL IMPRESSION  Well exam  ED Discharge Orders     None        Note:  This document was prepared using Dragon voice recognition software and may include unintentional dictation errors.

## 2021-11-28 NOTE — Progress Notes (Signed)
Pt presents today to complete physical, Pt denies any issues or concerns at this time/CL,RMA 

## 2021-11-30 ENCOUNTER — Other Ambulatory Visit: Payer: Self-pay

## 2021-11-30 DIAGNOSIS — N529 Male erectile dysfunction, unspecified: Secondary | ICD-10-CM

## 2021-11-30 MED ORDER — SILDENAFIL CITRATE 20 MG PO TABS: ORAL_TABLET | ORAL | 5 refills | Status: AC

## 2021-12-05 DIAGNOSIS — G4733 Obstructive sleep apnea (adult) (pediatric): Secondary | ICD-10-CM | POA: Diagnosis not present

## 2021-12-05 DIAGNOSIS — F902 Attention-deficit hyperactivity disorder, combined type: Secondary | ICD-10-CM | POA: Diagnosis not present

## 2021-12-05 DIAGNOSIS — Z79899 Other long term (current) drug therapy: Secondary | ICD-10-CM | POA: Diagnosis not present

## 2021-12-05 DIAGNOSIS — R69 Illness, unspecified: Secondary | ICD-10-CM | POA: Diagnosis not present

## 2022-02-13 DIAGNOSIS — F902 Attention-deficit hyperactivity disorder, combined type: Secondary | ICD-10-CM | POA: Diagnosis not present

## 2022-02-13 DIAGNOSIS — R69 Illness, unspecified: Secondary | ICD-10-CM | POA: Diagnosis not present

## 2022-02-13 DIAGNOSIS — Z79899 Other long term (current) drug therapy: Secondary | ICD-10-CM | POA: Diagnosis not present

## 2022-02-17 ENCOUNTER — Other Ambulatory Visit: Payer: Self-pay | Admitting: Physician Assistant

## 2022-02-17 DIAGNOSIS — E781 Pure hyperglyceridemia: Secondary | ICD-10-CM

## 2022-03-05 ENCOUNTER — Other Ambulatory Visit: Payer: Self-pay

## 2022-03-05 DIAGNOSIS — I10 Essential (primary) hypertension: Secondary | ICD-10-CM

## 2022-03-06 MED ORDER — METOPROLOL TARTRATE 25 MG PO TABS: 25.0000 mg | ORAL_TABLET | Freq: Two times a day (BID) | ORAL | 3 refills | Status: DC

## 2022-04-24 DIAGNOSIS — M25562 Pain in left knee: Secondary | ICD-10-CM | POA: Diagnosis not present

## 2022-04-24 DIAGNOSIS — M76899 Other specified enthesopathies of unspecified lower limb, excluding foot: Secondary | ICD-10-CM | POA: Diagnosis not present

## 2022-04-24 DIAGNOSIS — M25462 Effusion, left knee: Secondary | ICD-10-CM | POA: Diagnosis not present

## 2022-04-27 DIAGNOSIS — M76899 Other specified enthesopathies of unspecified lower limb, excluding foot: Secondary | ICD-10-CM | POA: Diagnosis not present

## 2022-04-27 DIAGNOSIS — M25562 Pain in left knee: Secondary | ICD-10-CM | POA: Diagnosis not present

## 2022-05-28 DIAGNOSIS — M25562 Pain in left knee: Secondary | ICD-10-CM | POA: Diagnosis not present

## 2022-06-11 DIAGNOSIS — M25562 Pain in left knee: Secondary | ICD-10-CM | POA: Diagnosis not present

## 2022-06-12 DIAGNOSIS — Z79899 Other long term (current) drug therapy: Secondary | ICD-10-CM | POA: Diagnosis not present

## 2022-06-12 DIAGNOSIS — R69 Illness, unspecified: Secondary | ICD-10-CM | POA: Diagnosis not present

## 2022-06-12 DIAGNOSIS — F902 Attention-deficit hyperactivity disorder, combined type: Secondary | ICD-10-CM | POA: Diagnosis not present

## 2022-06-18 DIAGNOSIS — R69 Illness, unspecified: Secondary | ICD-10-CM | POA: Diagnosis not present

## 2022-06-22 DIAGNOSIS — M2392 Unspecified internal derangement of left knee: Secondary | ICD-10-CM | POA: Diagnosis not present

## 2022-07-08 DIAGNOSIS — M2392 Unspecified internal derangement of left knee: Secondary | ICD-10-CM | POA: Diagnosis not present

## 2022-07-08 DIAGNOSIS — M948X8 Other specified disorders of cartilage, other site: Secondary | ICD-10-CM | POA: Diagnosis not present

## 2022-07-08 DIAGNOSIS — S83282A Other tear of lateral meniscus, current injury, left knee, initial encounter: Secondary | ICD-10-CM | POA: Diagnosis not present

## 2022-08-03 DIAGNOSIS — S83282D Other tear of lateral meniscus, current injury, left knee, subsequent encounter: Secondary | ICD-10-CM | POA: Diagnosis not present

## 2022-08-16 DIAGNOSIS — G4733 Obstructive sleep apnea (adult) (pediatric): Secondary | ICD-10-CM | POA: Diagnosis not present

## 2022-08-16 DIAGNOSIS — Z720 Tobacco use: Secondary | ICD-10-CM | POA: Diagnosis not present

## 2022-08-16 DIAGNOSIS — I10 Essential (primary) hypertension: Secondary | ICD-10-CM | POA: Diagnosis not present

## 2022-08-16 DIAGNOSIS — Z6841 Body Mass Index (BMI) 40.0 and over, adult: Secondary | ICD-10-CM | POA: Diagnosis not present

## 2022-08-16 DIAGNOSIS — K219 Gastro-esophageal reflux disease without esophagitis: Secondary | ICD-10-CM | POA: Diagnosis not present

## 2022-08-16 DIAGNOSIS — S83282D Other tear of lateral meniscus, current injury, left knee, subsequent encounter: Secondary | ICD-10-CM | POA: Diagnosis not present

## 2022-09-04 DIAGNOSIS — Z791 Long term (current) use of non-steroidal anti-inflammatories (NSAID): Secondary | ICD-10-CM | POA: Diagnosis not present

## 2022-09-04 DIAGNOSIS — G4733 Obstructive sleep apnea (adult) (pediatric): Secondary | ICD-10-CM | POA: Diagnosis not present

## 2022-09-04 DIAGNOSIS — Z880 Allergy status to penicillin: Secondary | ICD-10-CM | POA: Diagnosis not present

## 2022-09-04 DIAGNOSIS — I10 Essential (primary) hypertension: Secondary | ICD-10-CM | POA: Diagnosis not present

## 2022-09-04 DIAGNOSIS — K219 Gastro-esophageal reflux disease without esophagitis: Secondary | ICD-10-CM | POA: Diagnosis not present

## 2022-09-04 DIAGNOSIS — S83272A Complex tear of lateral meniscus, current injury, left knee, initial encounter: Secondary | ICD-10-CM | POA: Diagnosis not present

## 2022-09-12 DIAGNOSIS — M5134 Other intervertebral disc degeneration, thoracic region: Secondary | ICD-10-CM | POA: Diagnosis not present

## 2022-09-12 DIAGNOSIS — M47816 Spondylosis without myelopathy or radiculopathy, lumbar region: Secondary | ICD-10-CM | POA: Diagnosis not present

## 2022-09-20 DIAGNOSIS — M25562 Pain in left knee: Secondary | ICD-10-CM | POA: Diagnosis not present

## 2022-09-25 DIAGNOSIS — M25562 Pain in left knee: Secondary | ICD-10-CM | POA: Diagnosis not present

## 2022-09-26 ENCOUNTER — Other Ambulatory Visit: Payer: Self-pay | Admitting: Physician Assistant

## 2022-09-26 DIAGNOSIS — K219 Gastro-esophageal reflux disease without esophagitis: Secondary | ICD-10-CM

## 2022-09-28 DIAGNOSIS — M25562 Pain in left knee: Secondary | ICD-10-CM | POA: Diagnosis not present

## 2022-10-01 DIAGNOSIS — M25562 Pain in left knee: Secondary | ICD-10-CM | POA: Diagnosis not present

## 2022-10-04 DIAGNOSIS — M25562 Pain in left knee: Secondary | ICD-10-CM | POA: Diagnosis not present

## 2022-10-11 DIAGNOSIS — M25562 Pain in left knee: Secondary | ICD-10-CM | POA: Diagnosis not present

## 2022-10-16 ENCOUNTER — Other Ambulatory Visit: Payer: Self-pay | Admitting: Physician Assistant

## 2022-10-16 DIAGNOSIS — N529 Male erectile dysfunction, unspecified: Secondary | ICD-10-CM

## 2022-10-17 DIAGNOSIS — M25562 Pain in left knee: Secondary | ICD-10-CM | POA: Diagnosis not present

## 2022-10-30 ENCOUNTER — Ambulatory Visit: Payer: Self-pay

## 2022-10-30 DIAGNOSIS — Z0000 Encounter for general adult medical examination without abnormal findings: Secondary | ICD-10-CM

## 2022-10-30 LAB — POCT URINALYSIS DIPSTICK
Bilirubin, UA: NEGATIVE
Blood, UA: NEGATIVE
Glucose, UA: NEGATIVE
Ketones, UA: NEGATIVE
Leukocytes, UA: NEGATIVE
Nitrite, UA: NEGATIVE
Protein, UA: NEGATIVE
Spec Grav, UA: 1.025 (ref 1.010–1.025)
Urobilinogen, UA: 0.2 E.U./dL
pH, UA: 6 (ref 5.0–8.0)

## 2022-10-30 NOTE — Progress Notes (Signed)
Fasting labs, urine and EKG completed pre physical for [facility]-PD.

## 2022-10-31 DIAGNOSIS — M25562 Pain in left knee: Secondary | ICD-10-CM | POA: Diagnosis not present

## 2022-10-31 DIAGNOSIS — Z79899 Other long term (current) drug therapy: Secondary | ICD-10-CM | POA: Diagnosis not present

## 2022-10-31 DIAGNOSIS — F902 Attention-deficit hyperactivity disorder, combined type: Secondary | ICD-10-CM | POA: Diagnosis not present

## 2022-10-31 LAB — CMP12+LP+TP+TSH+6AC+PSA+CBC…
ALT: 28 IU/L (ref 0–44)
AST: 14 IU/L (ref 0–40)
Albumin/Globulin Ratio: 1.6 (ref 1.2–2.2)
Albumin: 4.5 g/dL (ref 4.1–5.1)
Alkaline Phosphatase: 88 IU/L (ref 44–121)
BUN/Creatinine Ratio: 15 (ref 9–20)
BUN: 14 mg/dL (ref 6–24)
Basophils Absolute: 0.1 10*3/uL (ref 0.0–0.2)
Basos: 1 %
Bilirubin Total: 0.4 mg/dL (ref 0.0–1.2)
Calcium: 9.4 mg/dL (ref 8.7–10.2)
Chloride: 100 mmol/L (ref 96–106)
Chol/HDL Ratio: 5.9 ratio — ABNORMAL HIGH (ref 0.0–5.0)
Cholesterol, Total: 229 mg/dL — ABNORMAL HIGH (ref 100–199)
Creatinine, Ser: 0.96 mg/dL (ref 0.76–1.27)
EOS (ABSOLUTE): 0.1 10*3/uL (ref 0.0–0.4)
Eos: 1 %
Estimated CHD Risk: 1.2 times avg. — ABNORMAL HIGH (ref 0.0–1.0)
Free Thyroxine Index: 1.8 (ref 1.2–4.9)
GGT: 55 IU/L (ref 0–65)
Globulin, Total: 2.8 g/dL (ref 1.5–4.5)
Glucose: 94 mg/dL (ref 70–99)
HDL: 39 mg/dL — ABNORMAL LOW (ref >39)
Hematocrit: 48.3 % (ref 37.5–51.0)
Hemoglobin: 16.5 g/dL (ref 13.0–17.7)
Immature Grans (Abs): 0 10*3/uL (ref 0.0–0.1)
Immature Granulocytes: 1 %
Iron: 118 ug/dL (ref 38–169)
LDH: 132 IU/L (ref 121–224)
LDL Chol Calc (NIH): 121 mg/dL — ABNORMAL HIGH (ref 0–99)
Lymphocytes Absolute: 3 10*3/uL (ref 0.7–3.1)
Lymphs: 39 %
MCH: 30.5 pg (ref 26.6–33.0)
MCHC: 34.2 g/dL (ref 31.5–35.7)
MCV: 89 fL (ref 79–97)
Monocytes Absolute: 0.6 10*3/uL (ref 0.1–0.9)
Monocytes: 8 %
Neutrophils Absolute: 4 10*3/uL (ref 1.4–7.0)
Neutrophils: 50 %
Phosphorus: 4 mg/dL (ref 2.8–4.1)
Platelets: 303 10*3/uL (ref 150–450)
Potassium: 4.4 mmol/L (ref 3.5–5.2)
Prostate Specific Ag, Serum: 0.8 ng/mL (ref 0.0–4.0)
RBC: 5.41 x10E6/uL (ref 4.14–5.80)
RDW: 12.8 % (ref 11.6–15.4)
Sodium: 139 mmol/L (ref 134–144)
T3 Uptake Ratio: 24 % (ref 24–39)
T4, Total: 7.7 ug/dL (ref 4.5–12.0)
TSH: 1.93 u[IU]/mL (ref 0.450–4.500)
Total Protein: 7.3 g/dL (ref 6.0–8.5)
Triglycerides: 391 mg/dL — ABNORMAL HIGH (ref 0–149)
Uric Acid: 7.2 mg/dL (ref 3.8–8.4)
VLDL Cholesterol Cal: 69 mg/dL — ABNORMAL HIGH (ref 5–40)
WBC: 7.7 10*3/uL (ref 3.4–10.8)
eGFR: 99 mL/min/{1.73_m2} (ref >59)

## 2022-11-07 ENCOUNTER — Ambulatory Visit: Payer: Self-pay | Admitting: Physician Assistant

## 2022-11-07 ENCOUNTER — Encounter: Payer: Self-pay | Admitting: Physician Assistant

## 2022-11-07 VITALS — BP 147/87 | HR 99 | Temp 97.6°F | Resp 16 | Ht 70.0 in | Wt 308.0 lb

## 2022-11-07 DIAGNOSIS — Z0000 Encounter for general adult medical examination without abnormal findings: Secondary | ICD-10-CM

## 2022-11-07 DIAGNOSIS — E781 Pure hyperglyceridemia: Secondary | ICD-10-CM

## 2022-11-07 MED ORDER — ROSUVASTATIN CALCIUM 40 MG PO TABS: 40.0000 mg | ORAL_TABLET | Freq: Every day | ORAL | 3 refills | Status: DC

## 2022-11-07 NOTE — Progress Notes (Signed)
[facility] ____________________________________________   None    (approximate)  I have reviewed the triage vital signs and the nursing notes.   HISTORY  Chief Complaint Annual Exam   HPI Kirk Smith is a 47 y.o. male patient presents for annual physical exam.  Patient states he is aware of elevated statin readings secondary to noncompliance of medication.         Past Medical History:  Diagnosis Date   Acute medial meniscus tear    Bulging lumbar disc    CAD (coronary artery disease)    Diastolic dysfunction    a. 09/2010 Echo: EF 55-60%, no rwma, mild LVH, gr1 DD. Mildly dil LA.   HLD (hyperlipidemia)    HTN (hypertension)    Kidney stone    Morbid obesity (HCC)    Palpitations    a. H/o tachycardia.   Sessile colonic polyp 2019    Patient Active Problem List   Diagnosis Date Noted   OSA on CPAP 09/26/2020   Morbid obesity (HCC) 09/26/2020   OSA (obstructive sleep apnea) 05/30/2020   CPAP use counseling 05/30/2020   Acquired trigger finger 12/21/2019   Carpal tunnel syndrome 12/21/2019   Erectile dysfunction 01/22/2019   History of arthroscopic knee surgery 01/22/2019   Acute lateral meniscus tear of right knee 12/18/2018   Encounter for long-term (current) use of high-risk medication 10/08/2018   Stiffness of joints of both hands 09/25/2018   Swelling of finger joint of left hand 09/25/2018   Trigger thumb of right hand 08/20/2017   Trigger finger of right hand 03/20/2017   Family history of gout 09/15/2015   Family history of psoriasis 09/15/2015   Finger pain, left 09/15/2015   Hip joint inflamed 09/01/2015   Rotator cuff tear 09/23/2014   DDD (degenerative disc disease), lumbar 03/23/2014   Lumbar paraspinal muscle spasm 03/23/2014   Hyperlipidemia 08/16/2010   Obesity 08/16/2010   Essential hypertension 08/16/2010   Tachycardia 08/16/2010   SHORTNESS OF BREATH 08/16/2010    Past Surgical History:   Procedure Laterality Date   CARPAL TUNNEL RELEASE Bilateral    COLONOSCOPY     KNEE ARTHROSCOPY Right 09/08/2015   Procedure: ARTHROSCOPY KNEE, partial lateral menisectomy, removal plica, partial synovectomy.;  Surgeon: Juanell Fairly, MD;  Location: [facility] ORS;  Service: Orthopedics;  Laterality: Right;   LITHOTRIPSY     MENISCUS REPAIR     TONSILLECTOMY      Prior to Admission medications   Medication Sig Start Date End Date Taking? Authorizing Provider  Coenzyme Q10 10 MG capsule Take by mouth.   Yes [provider]  esomeprazole (NEXIUM) 20 MG capsule Take 1 capsule (20 mg total) by mouth daily. 09/27/21  Yes Joni Reining, PA-C  meloxicam (MOBIC) 15 MG tablet Take 1 tablet by mouth daily. 07/24/21  Yes [provider]  methocarbamol (ROBAXIN) 750 MG tablet 1/2-1 po bid prn 08/11/20  Yes [provider]  metoprolol tartrate (LOPRESSOR) 25 MG tablet Take 1 tablet (25 mg total) by mouth 2 (two) times daily. 03/06/22  Yes Joni Reining, PA-C  Omega-3 Fatty Acids (FISH OIL) 1000 MG CAPS Take 1 capsule by mouth daily.   Yes [provider]  sildenafil (REVATIO) 20 MG tablet Take 2 or 3 tabs po prn as directed for planned activities 11/30/21  Yes Joni Reining, PA-C  atorvastatin (LIPITOR) 40 MG tablet Take 1 tablet (40 mg total) by mouth daily. Patient not taking: Reported on 11/07/2022  01/02/21   Johnna Acosta, NP  MYDAYIS 50 MG CP24 Take 1 capsule by mouth every morning. Patient not taking: Reported on 11/07/2022 05/19/20   [provider]    Allergies Penicillins, Phenergan [promethazine hcl], and Promethazine  Family History  Problem Relation Age of Onset   Heart failure Father        CHF   Heart disease Brother        heart problems    Social History Social History   Tobacco Use   Smoking status: Never   Smokeless tobacco: Former    Types: Chew    Quit date: 08/2018  Substance Use Topics   Alcohol use: Yes    Alcohol/week:  1.0 standard drink of alcohol    Types: 1 Standard drinks or equivalent per week    Comment: occasional    Drug use: No    Review of Systems Constitutional: No fever/chills Eyes: No visual changes. ENT: No sore throat. Cardiovascular: Denies chest pain. Respiratory: Denies shortness of breath. Gastrointestinal: No abdominal pain.  No nausea, no vomiting.  No diarrhea.  No constipation. Genitourinary: Negative for dysuria. Musculoskeletal: Negative for back pain.  Left knee pain Skin: Negative for rash. Neurological: Negative for headaches, focal weakness or numbness. Endocrine: Hyperlipidemia and hypertension Allergic/Immunilogical: Phenergan and penicillin ____________________________________________   PHYSICAL EXAM: BP 147/87  Pulse 99  Resp 16  Temp 97.6 F (36.4 C)  Temp src Temporal  SpO2 96 %  Weight 308 lb (139.7 kg)  Height 5\' 10"  (1.778 m)   BMI 44.19 kg/m2  BSA 2.63 m2   VITAL SIGNS: Constitutional: Alert and oriented. Well appearing and in no acute distress. Eyes: Conjunctivae are normal. PERRL. EOMI. Head: Atraumatic. Nose: No congestion/rhinnorhea. Mouth/Throat: Mucous membranes are moist.  Oropharynx non-erythematous. Neck: No stridor.  No cervical spine tenderness to palpation. Hematological/Lymphatic/Immunilogical: No cervical lymphadenopathy. Cardiovascular: Normal rate, regular rhythm. Grossly normal heart sounds.  Good peripheral circulation. Respiratory: Normal respiratory effort.  No retractions. Lungs CTAB. Gastrointestinal: Soft and nontender. No distention. No abdominal bruits. No CVA tenderness. Genitourinary: Deferred usculoskeletal: No lower extremity tenderness nor edema.  No joint effusions. Neurologic:  Normal speech and language. No gross focal neurologic deficits are appreciated. No gait instability. Skin:  Skin is warm, dry and intact. No rash noted. Psychiatric: Mood and affect are normal. Speech and behavior are  normal.  ____________________________________________   LABS ____________________________________________  EKG  Sinus rhythm 72 bpm          Component Ref Range & Units 8 d ago (10/30/22) 11 mo ago (11/28/21) 11 mo ago (11/21/21) 1 yr ago (11/28/20) 3 yr ago (09/16/19) 7 yr ago (12/06/14)  Color, UA yellow Dark Media planner Yellow yellow   Clarity, UA clear Clear Clear Clear clear   Glucose, UA Negative Negative Negative Negative Negative Negative   Bilirubin, UA neg Negative Negative Negative negative   Ketones, UA neg Negative Negative Negative negative   Spec Grav, UA 1.010 - 1.025 1.025 >=1.030 Abnormal  >=1.030 Abnormal  >=1.030 Abnormal  1.020   Blood, UA neg Negative Negative Negative negative   pH, UA 5.0 - 8.0 6.0 6.0 6.0 5.5 6.0   Protein, UA Negative Negative Positive Abnormal  CM Positive Abnormal  CM Negative Negative   Urobilinogen, UA 0.2 or 1.0 E.U./dL 0.2 0.2 0.2 0.2 0.2   Nitrite, UA neg Negative Negative Negative negative   Leukocytes, UA Negative Negative Negative Negative Negative Negative NEGATIVE R  Appearance dark  HAZY Abnormal  R  Odor none       Resulting Agency      [facility]                Component Ref Range & Units 8 d ago (10/30/22) 11 mo ago (11/21/21) 1 yr ago (11/28/20) 3 yr ago (09/16/19) 7 yr ago (09/01/15) 7 yr ago (12/06/14) 7 yr ago (12/06/14)  Glucose 70 - 99 mg/dL 94 454 High  96 R 86 R 102 High  R 114 High  R   Uric Acid 3.8 - 8.4 mg/dL 7.2 6.8 CM 6.6 CM 6.9 CM     Comment:            Therapeutic target for gout patients: <6.0  BUN 6 - 24 mg/dL 14 12 14 9 11  R 14 R   Creatinine, Ser 0.76 - 1.27 mg/dL 0.98 1.19 1.47 8.29 5.62 R 0.97 R   eGFR >59 mL/min/1.73 99 98 94      BUN/Creatinine Ratio 9 - 20 15 12 14 9      Sodium 134 - 144 mmol/L 139 142 143 140 140 R 141 R   Potassium 3.5 - 5.2 mmol/L 4.4 4.9 4.3 4.7 4.2 R 4.6 R, CM   Chloride 96 - 106 mmol/L 100 104 104 100 103 R 106 R   Calcium 8.7 - 10.2 mg/dL 9.4  9.3 9.3 9.5 9.5 R 9.0 R   Phosphorus 2.8 - 4.1 mg/dL 4.0 3.5 3.8 3.8     Total Protein 6.0 - 8.5 g/dL 7.3 7.2 7.4 7.7 7.7 R    Albumin 4.1 - 5.1 g/dL 4.5 4.7 R 4.5 R 4.7 R 4.1 R    Globulin, Total 1.5 - 4.5 g/dL 2.8 2.5 2.9 3.0     Albumin/Globulin Ratio 1.2 - 2.2 1.6 1.9 1.6 1.6     Bilirubin Total 0.0 - 1.2 mg/dL 0.4 0.4 0.6 0.5 0.4 R    Alkaline Phosphatase 44 - 121 IU/L 88 76 98 89 R 72 R    LDH 121 - 224 IU/L 132 145 189 178     AST 0 - 40 IU/L 14 19 19  32 27 R    ALT 0 - 44 IU/L 28 28 28  43 41 R    GGT 0 - 65 IU/L 55 35 45 44     Iron 38 - 169 ug/dL 130 95 865 784     Cholesterol, Total 100 - 199 mg/dL 696 High  295 284 132     Triglycerides 0 - 149 mg/dL 440 High  102 High  725 High  344 High      HDL >39 mg/dL 39 Low  32 Low  31 Low  33 Low      VLDL Cholesterol Cal 5 - 40 mg/dL 69 High  36 44 High  55 High      LDL Chol Calc (NIH) 0 - 99 mg/dL 366 High  77 70 74     Chol/HDL Ratio 0.0 - 5.0 ratio 5.9 High  4.5 CM 4.7 CM 4.9 CM     Comment:                                   T. Chol/HDL Ratio  Men  Women                               1/2 Avg.Risk  3.4    3.3                                   Avg.Risk  5.0    4.4                                2X Avg.Risk  9.6    7.1                                3X Avg.Risk 23.4   11.0  Estimated CHD Risk 0.0 - 1.0 times avg. 1.2 High  0.9 CM 0.9 CM 1.0 CM     Comment: The CHD Risk is based on the T. Chol/HDL ratio. Other factors affect CHD Risk such as hypertension, smoking, diabetes, severe obesity, and family history of premature CHD.  TSH 0.450 - 4.500 uIU/mL 1.930 1.450 1.700 1.420     T4, Total 4.5 - 12.0 ug/dL 7.7 7.0 7.7 7.4     T3 Uptake Ratio 24 - 39 % 24 24 25 23  Low      Free Thyroxine Index 1.2 - 4.9 1.8 1.7 1.9 1.7     Prostate Specific Ag, Serum 0.0 - 4.0 ng/mL 0.8 0.7 CM 0.7 CM 0.8 CM     Comment: Roche ECLIA methodology. According to the American Urological  Association, Serum PSA should decrease and remain at undetectable levels after radical prostatectomy. The AUA defines biochemical recurrence as an initial PSA value 0.2 ng/mL or greater followed by a subsequent confirmatory PSA value 0.2 ng/mL or greater. Values obtained with different assay methods or kits cannot be used interchangeably. Results cannot be interpreted as absolute evidence of the presence or absence of malignant disease.  WBC 3.4 - 10.8 x10E3/uL 7.7 6.5 6.3 7.6   10.5 R  RBC 4.14 - 5.80 x10E6/uL 5.41 5.05 5.10 5.22   5.04 R  Hemoglobin 13.0 - 17.7 g/dL 13.2 44.0 10.2 72.5   36.6 R  Hematocrit 37.5 - 51.0 % 48.3 44.8 45.8 46.8   45.1 R  MCV 79 - 97 fL 89 89 90 90   89.4 R  MCH 26.6 - 33.0 pg 30.5 30.5 30.6 30.8   30.1 R  MCHC 31.5 - 35.7 g/dL 44.0 34.7 42.5 95.6   38.7 R  RDW 11.6 - 15.4 % 12.8 12.1 12.1 12.2   12.8 R  Platelets 150 - 450 x10E3/uL 303 265 274 273   233 R  Neutrophils Not Estab. % 50 53 54 57     Lymphs Not Estab. % 39 35 34 34     Monocytes Not Estab. % 8 9 9 7      Eos Not Estab. % 1 2 2 1      Basos Not Estab. % 1 1 1 1      Neutrophils Absolute 1.4 - 7.0 x10E3/uL 4.0 3.5 3.4 4.3     Lymphocytes Absolute 0.7 - 3.1 x10E3/uL 3.0 2.3 2.2 2.6     Monocytes Absolute 0.1 - 0.9 x10E3/uL 0.6 0.6 0.5 0.6     EOS (ABSOLUTE) 0.0 - 0.4 x10E3/uL 0.1 0.1 0.1  0.1     Basophils Absolute 0.0 - 0.2 x10E3/uL 0.1 0.1 0.1 0.1     Immature Granulocytes Not Estab. % 1 0 0 0     Immature Grans (Abs)             ____________________________________________    ____________________________________________   INITIAL IMPRESSION / ASSESSMENT AND PLAN   As part of my medical decision making, I reviewed the following data within the electronic MEDICAL RECORD NUMBER       No acute findings on physical exam.  Discussed restarting statin to lower lipid profile.     ____________________________________________   FINAL CLINICAL IMPRESSION Well  exam    ED Discharge Orders     None        Note:  This document was prepared using Dragon voice recognition software and may include unintentional dictation errors.

## 2022-11-07 NOTE — Progress Notes (Signed)
Pt presents today to complete physical, Pt has no concerns at this time.

## 2022-11-09 DIAGNOSIS — M25562 Pain in left knee: Secondary | ICD-10-CM | POA: Diagnosis not present

## 2022-11-12 DIAGNOSIS — M25562 Pain in left knee: Secondary | ICD-10-CM | POA: Diagnosis not present

## 2022-11-20 DIAGNOSIS — M25562 Pain in left knee: Secondary | ICD-10-CM | POA: Diagnosis not present

## 2022-12-25 DIAGNOSIS — M76892 Other specified enthesopathies of left lower limb, excluding foot: Secondary | ICD-10-CM | POA: Diagnosis not present

## 2022-12-25 DIAGNOSIS — G8929 Other chronic pain: Secondary | ICD-10-CM | POA: Diagnosis not present

## 2022-12-25 DIAGNOSIS — M25562 Pain in left knee: Secondary | ICD-10-CM | POA: Diagnosis not present

## 2023-01-15 ENCOUNTER — Other Ambulatory Visit: Payer: Self-pay

## 2023-01-15 DIAGNOSIS — K219 Gastro-esophageal reflux disease without esophagitis: Secondary | ICD-10-CM

## 2023-01-15 DIAGNOSIS — M25562 Pain in left knee: Secondary | ICD-10-CM | POA: Diagnosis not present

## 2023-01-15 MED ORDER — ESOMEPRAZOLE MAGNESIUM 20 MG PO CPDR: 20.0000 mg | DELAYED_RELEASE_CAPSULE | Freq: Every day | ORAL | 3 refills | Status: DC

## 2023-01-29 DIAGNOSIS — M25562 Pain in left knee: Secondary | ICD-10-CM | POA: Diagnosis not present

## 2023-02-04 DIAGNOSIS — M25562 Pain in left knee: Secondary | ICD-10-CM | POA: Diagnosis not present

## 2023-02-05 DIAGNOSIS — Z79899 Other long term (current) drug therapy: Secondary | ICD-10-CM | POA: Diagnosis not present

## 2023-02-05 DIAGNOSIS — F902 Attention-deficit hyperactivity disorder, combined type: Secondary | ICD-10-CM | POA: Diagnosis not present

## 2023-02-06 ENCOUNTER — Other Ambulatory Visit: Payer: Self-pay

## 2023-02-06 DIAGNOSIS — M76899 Other specified enthesopathies of unspecified lower limb, excluding foot: Secondary | ICD-10-CM | POA: Diagnosis not present

## 2023-02-06 DIAGNOSIS — S83282D Other tear of lateral meniscus, current injury, left knee, subsequent encounter: Secondary | ICD-10-CM | POA: Diagnosis not present

## 2023-02-08 ENCOUNTER — Other Ambulatory Visit: Payer: Self-pay

## 2023-02-08 DIAGNOSIS — E7849 Other hyperlipidemia: Secondary | ICD-10-CM

## 2023-02-11 DIAGNOSIS — M25562 Pain in left knee: Secondary | ICD-10-CM | POA: Diagnosis not present

## 2023-02-11 DIAGNOSIS — S8412XD Injury of peroneal nerve at lower leg level, left leg, subsequent encounter: Secondary | ICD-10-CM | POA: Diagnosis not present

## 2023-02-11 DIAGNOSIS — M76892 Other specified enthesopathies of left lower limb, excluding foot: Secondary | ICD-10-CM | POA: Diagnosis not present

## 2023-02-11 DIAGNOSIS — G8929 Other chronic pain: Secondary | ICD-10-CM | POA: Diagnosis not present

## 2023-02-13 DIAGNOSIS — M25562 Pain in left knee: Secondary | ICD-10-CM | POA: Diagnosis not present

## 2023-02-18 DIAGNOSIS — M25562 Pain in left knee: Secondary | ICD-10-CM | POA: Diagnosis not present

## 2023-02-22 DIAGNOSIS — M25562 Pain in left knee: Secondary | ICD-10-CM | POA: Diagnosis not present

## 2023-02-25 DIAGNOSIS — M25562 Pain in left knee: Secondary | ICD-10-CM | POA: Diagnosis not present

## 2023-03-05 ENCOUNTER — Other Ambulatory Visit: Payer: Self-pay | Admitting: Physician Assistant

## 2023-03-05 DIAGNOSIS — I10 Essential (primary) hypertension: Secondary | ICD-10-CM

## 2023-03-05 DIAGNOSIS — S8412XD Injury of peroneal nerve at lower leg level, left leg, subsequent encounter: Secondary | ICD-10-CM | POA: Diagnosis not present

## 2023-03-06 DIAGNOSIS — M25562 Pain in left knee: Secondary | ICD-10-CM | POA: Diagnosis not present

## 2023-03-13 DIAGNOSIS — M25562 Pain in left knee: Secondary | ICD-10-CM | POA: Diagnosis not present

## 2023-03-14 ENCOUNTER — Other Ambulatory Visit: Payer: Self-pay

## 2023-03-14 DIAGNOSIS — I10 Essential (primary) hypertension: Secondary | ICD-10-CM

## 2023-03-14 MED ORDER — METOPROLOL TARTRATE 25 MG PO TABS
25.0000 mg | ORAL_TABLET | Freq: Two times a day (BID) | ORAL | 3 refills | Status: DC
Start: 2023-03-14 — End: 2024-03-02

## 2023-03-18 DIAGNOSIS — M25562 Pain in left knee: Secondary | ICD-10-CM | POA: Diagnosis not present

## 2023-03-21 DIAGNOSIS — M25562 Pain in left knee: Secondary | ICD-10-CM | POA: Diagnosis not present

## 2023-03-25 DIAGNOSIS — M25562 Pain in left knee: Secondary | ICD-10-CM | POA: Diagnosis not present

## 2023-03-27 DIAGNOSIS — M76899 Other specified enthesopathies of unspecified lower limb, excluding foot: Secondary | ICD-10-CM | POA: Diagnosis not present

## 2023-03-27 DIAGNOSIS — G8929 Other chronic pain: Secondary | ICD-10-CM | POA: Diagnosis not present

## 2023-03-27 DIAGNOSIS — M25562 Pain in left knee: Secondary | ICD-10-CM | POA: Diagnosis not present

## 2023-04-24 ENCOUNTER — Encounter: Payer: Self-pay | Admitting: Physician Assistant

## 2023-04-24 ENCOUNTER — Ambulatory Visit: Payer: 59 | Admitting: Physician Assistant

## 2023-04-24 ENCOUNTER — Ambulatory Visit: Payer: Self-pay | Admitting: Physician Assistant

## 2023-04-24 VITALS — BP 137/95 | HR 65 | Temp 97.7°F | Resp 14

## 2023-04-24 DIAGNOSIS — Z7689 Persons encountering health services in other specified circumstances: Secondary | ICD-10-CM

## 2023-04-24 NOTE — Progress Notes (Signed)
  Subjective:     Patient ID: Kirk Smith, male    DOB: 05/21/1976, 47 y.o.   MRN: [id number]  HPI Patient is in today for a return to work evaluation. Pt underwent a left supericial peroneal nerve hydrodissection on 02/11/2023 and received supportive physical therapy post procedure. Reports minimal pain to the site and improved mobility. Pt feels confident in returning to full duty at work.   ROS Negative except for above listed.      Objective:    BP (!) 137/95   Pulse 65   Temp 97.7 F (36.5 C)   Resp 14   SpO2 98%    Physical Exam Constitutional:      Appearance: He is not ill-appearing.  Cardiovascular:     Rate and Rhythm: Normal rate.  Pulmonary:     Effort: Pulmonary effort is normal.     Breath sounds: Normal breath sounds.  Musculoskeletal:        General: Normal range of motion.  Skin:    General: Skin is warm and dry.  Neurological:     Mental Status: He is alert and oriented to person, place, and time. Mental status is at baseline.  Psychiatric:        Mood and Affect: Mood normal.        Behavior: Behavior normal.       Assessment & Plan:   Problem List Items Addressed This Visit   None Visit Diagnoses     Return to work evaluation    -  Primary      Return to work assessment completed. Patient appropriate to return to full duty. Documentation provided.   Pt to present to clinic as need for concerns or needs.   Delma Post, RN

## 2023-04-24 NOTE — Progress Notes (Signed)
Stated post repair on left knee scope and note from ortho to return full duty 04/26/23 and was told he has hamstring tear.  Reports he is ready for full duty and will always have pain.  Ambulated to clinic unassisted and climbed on exam table.  Works for [employer].

## 2023-05-06 DIAGNOSIS — Z79899 Other long term (current) drug therapy: Secondary | ICD-10-CM | POA: Diagnosis not present

## 2023-05-06 DIAGNOSIS — F902 Attention-deficit hyperactivity disorder, combined type: Secondary | ICD-10-CM | POA: Diagnosis not present

## 2023-06-24 DIAGNOSIS — G4733 Obstructive sleep apnea (adult) (pediatric): Secondary | ICD-10-CM | POA: Diagnosis not present

## 2023-06-24 DIAGNOSIS — Z860101 Personal history of adenomatous and serrated colon polyps: Secondary | ICD-10-CM | POA: Diagnosis not present

## 2023-06-24 DIAGNOSIS — K625 Hemorrhage of anus and rectum: Secondary | ICD-10-CM | POA: Diagnosis not present

## 2023-06-24 DIAGNOSIS — Z6841 Body Mass Index (BMI) 40.0 and over, adult: Secondary | ICD-10-CM | POA: Diagnosis not present

## 2023-07-15 ENCOUNTER — Ambulatory Visit: Payer: Self-pay | Admitting: Physician Assistant

## 2023-07-15 ENCOUNTER — Encounter: Payer: Self-pay | Admitting: Physician Assistant

## 2023-07-15 DIAGNOSIS — R051 Acute cough: Secondary | ICD-10-CM

## 2023-07-15 MED ORDER — HYDROCOD POLI-CHLORPHE POLI ER 10-8 MG/5ML PO SUER: 5.0000 mL | Freq: Every evening | ORAL | 0 refills | Status: DC | PRN

## 2023-07-15 MED ORDER — BENZONATATE 100 MG PO CAPS: 200.0000 mg | ORAL_CAPSULE | Freq: Three times a day (TID) | ORAL | 0 refills | Status: DC | PRN

## 2023-07-15 MED ORDER — FEXOFENADINE-PSEUDOEPHED ER 60-120 MG PO TB12: 1.0000 | ORAL_TABLET | Freq: Two times a day (BID) | ORAL | 0 refills | Status: DC

## 2023-07-15 NOTE — Progress Notes (Signed)
   Subjective: Sinus congestion and cough    Patient ID: Kirk Smith, male    DOB: [birth year], 48 y.o.   MRN: [id number]  HPI Patient complaining of 10 days of sinus congestion and cough.  Initially cough was worse at night secondary to postnasal drainage.  In the past 4 days cough has increased coughing spells.  Denies fever/chills.  No recent travel or known contact with COVID-19.  Patient has trouble sleeping secondary to his sleep apnea and nasal congestion interfering with his CPAP machine.   Review of Systems Hypertension, hyperlipidemia, morbid obesity, and obstructive sleep apnea    Objective:   Physical Exam No acute distress.  Vital signs deferred. HEENT is remarkable for edematous nasal turbinates and postnasal drainage. Neck is supple for lymphadenopathy or bruits. Lungs are clear to auscultation but increased coughing spells with deep inspirations. Heart is regular rate and rhythm.     Assessment & Plan: Viral URI  Patient given a prescription for fexofenadine /Sudafed, Tessalon  Perles, and Tussionex which is to be used before sleeping.  Follow-up if no improvement in 3 days.

## 2023-07-15 NOTE — Progress Notes (Signed)
 S/Sx started 07/06/2023 with a sore throat - Resolved Cough - productive (grayish/green & blood tinged at times) Nasal drainage - graysish/green Head ache Pain/pressure around eyes Left ear - pressure SOB - at rest (not bad)  Denies fever, N/V/D  Taking OTC Mucinex D & Zyzal  Cough is the worst.  Coughs so hard almost feels like he's going to throw up

## 2023-07-17 ENCOUNTER — Other Ambulatory Visit: Payer: Self-pay

## 2023-07-17 DIAGNOSIS — Z1152 Encounter for screening for COVID-19: Secondary | ICD-10-CM

## 2023-07-17 LAB — POCT INFLUENZA A/B
Influenza A, POC: NEGATIVE
Influenza B, POC: NEGATIVE

## 2023-07-17 NOTE — Progress Notes (Signed)
 Was seen in the clinic on Monday (07/15/23) for Acute Cough & Tx'd with Allegra D, Tussionex & Tessalon  Pearls Called Clinic today & said on Monday S/Sx worsened & Tuesday stayed in bed all day Fever as high as 100.7 - started taking Tylenol  & it's staying around 99 Body aches & joint aches Today (Wednesday 07/17/23) - states cough is better, fever is better but still having body & joint achiness.  Questioning if he needs Flu test. States I've never had the flu.  Also, said he's scheduled to leave Sunday for a conference. Didn't take a flu shot this past fall. Reviewed above changes with Tanda Sharps, PA-C & Ron recommended that Kirk Smith come in for Influenza A/B test. [name] will call clinic when he arrives in the [facility] parking lot.

## 2023-08-03 ENCOUNTER — Other Ambulatory Visit: Payer: Self-pay | Admitting: Physician Assistant

## 2023-08-05 ENCOUNTER — Other Ambulatory Visit: Payer: Self-pay

## 2023-08-05 DIAGNOSIS — E785 Hyperlipidemia, unspecified: Secondary | ICD-10-CM

## 2023-08-05 MED ORDER — ROSUVASTATIN CALCIUM 40 MG PO TABS
40.0000 mg | ORAL_TABLET | Freq: Every day | ORAL | 3 refills | Status: DC
Start: 2023-08-05 — End: 2023-11-18

## 2023-08-05 NOTE — Telephone Encounter (Signed)
Duplicate request - refill sent electronically earlier today

## 2023-08-08 DIAGNOSIS — F902 Attention-deficit hyperactivity disorder, combined type: Secondary | ICD-10-CM | POA: Diagnosis not present

## 2023-08-08 DIAGNOSIS — Z79899 Other long term (current) drug therapy: Secondary | ICD-10-CM | POA: Diagnosis not present

## 2023-08-14 DIAGNOSIS — F902 Attention-deficit hyperactivity disorder, combined type: Secondary | ICD-10-CM | POA: Diagnosis not present

## 2023-09-16 ENCOUNTER — Ambulatory Visit (INDEPENDENT_AMBULATORY_CARE_PROVIDER_SITE_OTHER): Payer: Self-pay

## 2023-09-16 DIAGNOSIS — Z09 Encounter for follow-up examination after completed treatment for conditions other than malignant neoplasm: Secondary | ICD-10-CM | POA: Diagnosis not present

## 2023-09-16 DIAGNOSIS — K640 First degree hemorrhoids: Secondary | ICD-10-CM | POA: Diagnosis not present

## 2023-09-16 DIAGNOSIS — Z860101 Personal history of adenomatous and serrated colon polyps: Secondary | ICD-10-CM | POA: Diagnosis not present

## 2023-09-16 DIAGNOSIS — Z860100 Personal history of colon polyps, unspecified: Secondary | ICD-10-CM | POA: Diagnosis not present

## 2023-11-08 ENCOUNTER — Other Ambulatory Visit: Payer: Self-pay

## 2023-11-11 ENCOUNTER — Ambulatory Visit: Payer: Self-pay

## 2023-11-11 DIAGNOSIS — Z0000 Encounter for general adult medical examination without abnormal findings: Secondary | ICD-10-CM

## 2023-11-11 LAB — POCT URINALYSIS DIPSTICK
Bilirubin, UA: POSITIVE
Blood, UA: NEGATIVE
Glucose, UA: NEGATIVE
Ketones, UA: NEGATIVE
Leukocytes, UA: NEGATIVE
Nitrite, UA: NEGATIVE
Protein, UA: NEGATIVE
Spec Grav, UA: 1.025 (ref 1.010–1.025)
Urobilinogen, UA: 0.2 U/dL
pH, UA: 6 (ref 5.0–8.0)

## 2023-11-13 LAB — CMP12+LP+TP+TSH+6AC+PSA+CBC…
ALT: 31 IU/L (ref 0–44)
AST: 24 IU/L (ref 0–40)
Albumin: 4.4 g/dL (ref 4.1–5.1)
Alkaline Phosphatase: 71 IU/L (ref 44–121)
BUN/Creatinine Ratio: 17 (ref 9–20)
BUN: 14 mg/dL (ref 6–24)
Basophils Absolute: 0 10*3/uL (ref 0.0–0.2)
Basos: 1 %
Bilirubin Total: 0.4 mg/dL (ref 0.0–1.2)
Calcium: 9.2 mg/dL (ref 8.7–10.2)
Chloride: 102 mmol/L (ref 96–106)
Chol/HDL Ratio: 3.9 ratio (ref 0.0–5.0)
Cholesterol, Total: 122 mg/dL (ref 100–199)
Creatinine, Ser: 0.84 mg/dL (ref 0.76–1.27)
EOS (ABSOLUTE): 0.1 10*3/uL (ref 0.0–0.4)
Eos: 2 %
Estimated CHD Risk: 0.7 times avg. (ref 0.0–1.0)
Free Thyroxine Index: 1.7 (ref 1.2–4.9)
GGT: 34 IU/L (ref 0–65)
Globulin, Total: 2.7 g/dL (ref 1.5–4.5)
Glucose: 99 mg/dL (ref 70–99)
HDL: 31 mg/dL — ABNORMAL LOW (ref >39)
Hematocrit: 45 % (ref 37.5–51.0)
Hemoglobin: 15.1 g/dL (ref 13.0–17.7)
Immature Grans (Abs): 0 10*3/uL (ref 0.0–0.1)
Immature Granulocytes: 0 %
Iron: 118 ug/dL (ref 38–169)
LDH: 138 IU/L (ref 121–224)
LDL Chol Calc (NIH): 53 mg/dL (ref 0–99)
Lymphocytes Absolute: 2.7 10*3/uL (ref 0.7–3.1)
Lymphs: 42 %
MCH: 30.3 pg (ref 26.6–33.0)
MCHC: 33.6 g/dL (ref 31.5–35.7)
MCV: 90 fL (ref 79–97)
Monocytes Absolute: 0.7 10*3/uL (ref 0.1–0.9)
Monocytes: 11 %
Neutrophils Absolute: 3 10*3/uL (ref 1.4–7.0)
Neutrophils: 44 %
Phosphorus: 3.8 mg/dL (ref 2.8–4.1)
Platelets: 231 10*3/uL (ref 150–450)
Potassium: 4.5 mmol/L (ref 3.5–5.2)
Prostate Specific Ag, Serum: 0.5 ng/mL (ref 0.0–4.0)
RBC: 4.98 x10E6/uL (ref 4.14–5.80)
RDW: 12.3 % (ref 11.6–15.4)
Sodium: 139 mmol/L (ref 134–144)
T3 Uptake Ratio: 24 % (ref 24–39)
T4, Total: 7 ug/dL (ref 4.5–12.0)
TSH: 2.43 u[IU]/mL (ref 0.450–4.500)
Total Protein: 7.1 g/dL (ref 6.0–8.5)
Triglycerides: 234 mg/dL — ABNORMAL HIGH (ref 0–149)
Uric Acid: 5.8 mg/dL (ref 3.8–8.4)
VLDL Cholesterol Cal: 38 mg/dL (ref 5–40)
WBC: 6.5 10*3/uL (ref 3.4–10.8)
eGFR: 109 mL/min/{1.73_m2} (ref >59)

## 2023-11-18 ENCOUNTER — Ambulatory Visit: Payer: Self-pay | Admitting: Physician Assistant

## 2023-11-18 ENCOUNTER — Other Ambulatory Visit: Payer: Self-pay

## 2023-11-18 ENCOUNTER — Encounter: Payer: Self-pay | Admitting: Physician Assistant

## 2023-11-18 VITALS — BP 138/90 | HR 92 | Temp 97.8°F | Resp 16 | Wt 315.0 lb

## 2023-11-18 DIAGNOSIS — E785 Hyperlipidemia, unspecified: Secondary | ICD-10-CM

## 2023-11-18 DIAGNOSIS — Z0000 Encounter for general adult medical examination without abnormal findings: Secondary | ICD-10-CM

## 2023-11-18 LAB — POCT URINALYSIS DIPSTICK
Bilirubin, UA: NEGATIVE
Blood, UA: NEGATIVE
Glucose, UA: NEGATIVE
Ketones, UA: NEGATIVE
Leukocytes, UA: NEGATIVE
Nitrite, UA: NEGATIVE
Protein, UA: NEGATIVE
Spec Grav, UA: 1.015 (ref 1.010–1.025)
Urobilinogen, UA: 0.2 U/dL
pH, UA: 6 (ref 5.0–8.0)

## 2023-11-18 MED ORDER — ROSUVASTATIN CALCIUM 40 MG PO TABS: 40.0000 mg | ORAL_TABLET | Freq: Every day | ORAL | 3 refills | Status: AC

## 2023-11-18 NOTE — Progress Notes (Signed)
 [facility] [place] ____________________________________________   None    (approximate)  I have reviewed the triage vital signs and the nursing notes.   HISTORY  Chief Complaint No chief complaint on file.   HPI Kirk Smith is a 48 y.o. male patient presents for annual physical exam.  Patient was concerned for contracture of the third digit bilateral hand.  Onset of complaint greater than 1 year.  Worsening in the past 3 months.  Patient also complains of contraction to the toes of the right foot.         Past Medical History:  Diagnosis Date   Acute medial meniscus tear    Bulging lumbar disc    CAD (coronary artery disease)    Diastolic dysfunction    a. 09/2010 Echo: EF 55-60%, no rwma, mild LVH, gr1 DD. Mildly dil LA.   HLD (hyperlipidemia)    HTN (hypertension)    Kidney stone    Morbid obesity (HCC)    Palpitations    a. H/o tachycardia.   Sessile colonic polyp 2019    Patient Active Problem List   Diagnosis Date Noted   OSA on CPAP 09/26/2020   Morbid obesity (HCC) 09/26/2020   OSA (obstructive sleep apnea) 05/30/2020   CPAP use counseling 05/30/2020   Acquired trigger finger 12/21/2019   Carpal tunnel syndrome 12/21/2019   Erectile dysfunction 01/22/2019   History of arthroscopic knee surgery 01/22/2019   Acute lateral meniscus tear of right knee 12/18/2018   Encounter for long-term (current) use of high-risk medication 10/08/2018   Stiffness of joints of both hands 09/25/2018   Swelling of finger joint of left hand 09/25/2018   Trigger thumb of right hand 08/20/2017   Trigger finger of right hand 03/20/2017   Family history of gout 09/15/2015   Family history of psoriasis 09/15/2015   Finger pain, left 09/15/2015   Hip joint inflamed 09/01/2015   Rotator cuff tear 09/23/2014   DDD (degenerative disc disease), lumbar 03/23/2014   Lumbar paraspinal muscle spasm 03/23/2014   Hyperlipidemia 08/16/2010   Obesity  08/16/2010   Essential hypertension 08/16/2010   Tachycardia 08/16/2010   SHORTNESS OF BREATH 08/16/2010    Past Surgical History:  Procedure Laterality Date   CARPAL TUNNEL RELEASE Bilateral    COLONOSCOPY     KNEE ARTHROSCOPY Right 09/08/2015   Procedure: ARTHROSCOPY KNEE, partial lateral menisectomy, removal plica, partial synovectomy.;  Surgeon: Rande Bushy, MD;  Location: [facility] ORS;  Service: Orthopedics;  Laterality: Right;   LITHOTRIPSY     MENISCUS REPAIR     TONSILLECTOMY      Prior to Admission medications   Medication Sig Start Date End Date Taking? Authorizing Provider  diclofenac Sodium (VOLTAREN) 1 % GEL Apply 2 g topically 4 (four) times daily. 10/08/18  Yes [provider]  esomeprazole  (NEXIUM ) 20 MG capsule Take 1 capsule (20 mg total) by mouth daily. 01/15/23  Yes Marcina Severe, PA-C  levocetirizine (XYZAL) 5 MG tablet Take 5 mg by mouth every evening.   Yes [provider]  MYDAYIS 50 MG CP24 Take 1 capsule by mouth every morning. 05/19/20  Yes [provider]  Omega-3 Fatty Acids (FISH OIL) 1000 MG CAPS Take 1 capsule by mouth daily.   Yes [provider]  amphetamine-dextroamphetamine (ADDERALL XR) 30 MG 24 hr capsule Take 30 mg by mouth daily.    [provider]  Coenzyme Q10 10 MG capsule Take by mouth. Patient not taking: Reported on  11/18/2023    [provider]  MEGARED OMEGA-3 KRILL OIL PO  07/09/08   [provider]  meloxicam (MOBIC) 15 MG tablet Take 1 tablet by mouth daily. 07/24/21   [provider]  metoprolol  tartrate (LOPRESSOR ) 25 MG tablet Take 1 tablet (25 mg total) by mouth 2 (two) times daily. 03/14/23   Marcina Severe, PA-C  Multiple Vitamin (MULTI-VITAMIN) tablet Take 1 tablet by mouth daily.    [provider]  rosuvastatin  (CRESTOR ) 40 MG tablet Take 1 tablet (40 mg total) by mouth daily. Patient not taking: Reported on 11/18/2023 08/05/23   Guadelupe Leech, PA-C   sildenafil  (REVATIO ) 20 MG tablet Take 2 or 3 tabs po prn as directed for planned activities Patient not taking: Reported on 04/24/2023 11/30/21   Marcina Severe, PA-C    Allergies Penicillins, Phenergan  [promethazine  hcl], and Promethazine   Family History  Problem Relation Age of Onset   Heart failure Father        CHF   Heart disease Brother        heart problems    Social History Social History   Tobacco Use   Smoking status: Never   Smokeless tobacco: Former    Types: Chew    Quit date: 08/2018  Substance Use Topics   Alcohol use: Yes    Alcohol/week: 1.0 standard drink of alcohol    Types: 1 Standard drinks or equivalent per week    Comment: occasional    Drug use: No    Review of Systems Constitutional: No fever/chills Eyes: No visual changes. ENT: No sore throat. Cardiovascular: Denies chest pain. Respiratory: Denies shortness of breath. Gastrointestinal: No abdominal pain.  No nausea, no vomiting.  No diarrhea.  No constipation. Genitourinary: Negative for dysuria. Musculoskeletal: Negative for back pain.  Contracture bilateral third digit of hand.  Contracture of toes of the right foot. Skin: Negative for rash. Neurological: Negative for headaches, focal weakness or numbness.  Endocrine: Hyperlipidemia. ____________________________________________   PHYSICAL EXAM:  VITAL SIGNS: BP 138/90BP. 138/90. Data is abnormal. Taken on 11/18/23 2:02 PM  Pulse Rate 92  Temp 97.8 F (36.6 C)  Weight 315 lb (142.9 kg)Weight. 315 lb (142.9 kg). Data is abnormal. Taken on 11/18/23 2:02 PM  Resp 16  SpO2 96 %   Constitutional: Alert and oriented. Well appearing and in no acute distress. Eyes: Conjunctivae are normal. PERRL. EOMI. Head: Atraumatic. Nose: No congestion/rhinnorhea. Mouth/Throat: Mucous membranes are moist.  Oropharynx non-erythematous. Neck: No stridor. No cervical spine tenderness to palpation. Hematological/Lymphatic/Immunilogical: No cervical  lymphadenopathy. Cardiovascular: Normal rate, regular rhythm. Grossly normal heart sounds.  Good peripheral circulation. Respiratory: Normal respiratory effort.  No retractions. Lungs CTAB. Gastrointestinal: Soft and nontender. No distention. No abdominal bruits. No CVA tenderness. Genitourinary: Deferred Musculoskeletal: No lower extremity tenderness nor edema.  No joint effusions. Neurologic:  Normal speech and language. No gross focal neurologic deficits are appreciated. No gait instability. Skin:  Skin is warm, dry and intact. No rash noted. Psychiatric: Mood and affect are normal. Speech and behavior are normal.  ____________________________________________   LABS           Component Ref Range & Units (hover) 14:29 (11/18/23) 7 d ago (11/11/23) 1 yr ago (10/30/22) 1 yr ago (11/28/21) 1 yr ago (11/21/21) 2 yr ago (11/28/20) 4 yr ago (09/16/19)  Color, UA yellow Dark Yellow yellow Dark Media planner Yellow yellow  Clarity, UA clear Clear clear Clear Clear Clear clear  Glucose, UA Negative Negative Negative Negative Negative  Negative Negative  Bilirubin, UA neg Positive CM neg Negative Negative Negative negative  Ketones, UA neg Negative neg Negative Negative Negative negative  Spec Grav, UA 1.015 1.025 1.025 >=1.030 Abnormal  >=1.030 Abnormal  >=1.030 Abnormal  1.020  Blood, UA neg Negative neg Negative Negative Negative negative  pH, UA 6.0 6.0 6.0 6.0 6.0 5.5 6.0  Protein, UA Negative Negative Negative Positive Abnormal  CM Positive Abnormal  CM Negative Negative  Urobilinogen, UA 0.2 0.2 0.2 0.2 0.2 0.2 0.2  Nitrite, UA neg Negative neg Negative Negative Negative negative  Leukocytes, UA Negative Negative Negative Negative Negative Negative Negative  Appearance   dark      Odor   none                   Component Ref Range & Units (hover) 7 d ago (11/11/23) 9 mo ago (02/08/23) 1 yr ago (10/30/22) 1 yr ago (11/21/21) 2 yr ago (11/28/20) 4 yr ago (09/16/19) 8 yr ago (09/01/15)   Glucose 99  94 101 High  96 R 86 R 102 High  R  Uric Acid 5.8  7.2 CM 6.8 CM 6.6 CM 6.9 CM   Comment:            Therapeutic target for gout patients: <6.0  BUN 14  14 12 14 9 11  R  Creatinine, Ser 0.84  0.96 0.98 1.02 0.95 0.93 R  eGFR 109  99 98 94    BUN/Creatinine Ratio 17  15 12 14 9    Sodium 139  139 142 143 140 140 R  Potassium 4.5  4.4 4.9 4.3 4.7 4.2 R  Chloride 102  100 104 104 100 103 R  Calcium  9.2  9.4 9.3 9.3 9.5 9.5 R  Phosphorus 3.8  4.0 3.5 3.8 3.8   Total Protein 7.1  7.3 7.2 7.4 7.7 7.7 R  Albumin 4.4  4.5 4.7 R 4.5 R 4.7 R 4.1 R  Globulin, Total 2.7  2.8 2.5 2.9 3.0   Bilirubin Total 0.4  0.4 0.4 0.6 0.5 0.4 R  Alkaline Phosphatase 71  88 76 98 89 R 72 R  LDH 138  132 145 189 178   AST 24  14 19 19  32 27 R  ALT 31  28 28 28  43 41 R  GGT 34  55 35 45 44   Iron 118  118 95 100 125   Cholesterol, Total 122 122 229 High  145 145 162   Triglycerides 234 High  310 High  391 High  213 High  275 High  344 High    HDL 31 Low  30 Low  39 Low  32 Low  31 Low  33 Low    VLDL Cholesterol Cal 38 47 High  69 High  36 44 High  55 High    LDL Chol Calc (NIH) 53 45 409 High  77 70 74   Chol/HDL Ratio 3.9 4.1 CM 5.9 High  CM 4.5 CM 4.7 CM 4.9 CM   Comment:                                   T. Chol/HDL Ratio  Men  Women                               1/2 Avg.Risk  3.4    3.3                                   Avg.Risk  5.0    4.4                                2X Avg.Risk  9.6    7.1                                3X Avg.Risk 23.4   11.0  Estimated CHD Risk 0.7  1.2 High  CM 0.9 CM 0.9 CM 1.0 CM   Comment: The CHD Risk is based on the T. Chol/HDL ratio. Other factors affect CHD Risk such as hypertension, smoking, diabetes, severe obesity, and family history of premature CHD.  TSH 2.430  1.930 1.450 1.700 1.420   T4, Total 7.0  7.7 7.0 7.7 7.4   T3 Uptake Ratio 24  24 24 25 23  Low    Free Thyroxine Index 1.7  1.8 1.7 1.9 1.7    Prostate Specific Ag, Serum 0.5  0.8 CM 0.7 CM 0.7 CM 0.8 CM   Comment: Roche ECLIA methodology. According to the American Urological Association, Serum PSA should decrease and remain at undetectable levels after radical prostatectomy. The AUA defines biochemical recurrence as an initial PSA value 0.2 ng/mL or greater followed by a subsequent confirmatory PSA value 0.2 ng/mL or greater. Values obtained with different assay methods or kits cannot be used interchangeably. Results cannot be interpreted as absolute evidence of the presence or absence of malignant disease.  WBC 6.5  7.7 6.5 6.3 7.6   RBC 4.98  5.41 5.05 5.10 5.22   Hemoglobin 15.1  16.5 15.4 15.6 16.1   Hematocrit 45.0  48.3 44.8 45.8 46.8   MCV 90  89 89 90 90   MCH 30.3  30.5 30.5 30.6 30.8   MCHC 33.6  34.2 34.4 34.1 34.4   RDW 12.3  12.8 12.1 12.1 12.2   Platelets 231  303 265 274 273   Neutrophils 44  50 53 54 57   Lymphs 42  39 35 34 34   Monocytes 11  8 9 9 7    Eos 2  1 2 2 1    Basos 1  1 1 1 1    Neutrophils Absolute 3.0  4.0 3.5 3.4 4.3   Lymphocytes Absolute 2.7  3.0 2.3 2.2 2.6   Monocytes Absolute 0.7  0.6 0.6 0.5 0.6   EOS (ABSOLUTE) 0.1  0.1 0.1 0.1 0.1   Basophils Absolute 0.0  0.1 0.1 0.1 0.1   Immature Granulocytes 0  1 0 0 0   Immature Grans (Abs) 0.0  0.0 0.0 0.0 0.0                    ____________________________________________  EKG  Normal sinus rhythm at 75 bpm ____________________________________________    ____________________________________________   INITIAL IMPRESSION / ASSESSMENT AND PLAN As part of my medical decision making, I reviewed the following data within the electronic MEDICAL RECORD NUMBER  No acute findings on physical exam, EKG, labs.  Advised patient to follow-up with occupational therapy at [facility] for definitive evaluation and treatment of the contracted fingers of the bilateral hand.        ____________________________________________   FINAL  CLINICAL IMPRESSION Well exam  ED Discharge Orders     None        Note:  This document was prepared using Dragon voice recognition software and may include unintentional dictation errors.

## 2023-11-18 NOTE — Progress Notes (Signed)
 Here for yearly physical with sworn [employer] police.  UA repeated.

## 2023-12-03 DIAGNOSIS — F902 Attention-deficit hyperactivity disorder, combined type: Secondary | ICD-10-CM | POA: Diagnosis not present

## 2023-12-03 DIAGNOSIS — Z79899 Other long term (current) drug therapy: Secondary | ICD-10-CM | POA: Diagnosis not present

## 2024-01-05 ENCOUNTER — Other Ambulatory Visit: Payer: Self-pay | Admitting: Physician Assistant

## 2024-01-05 DIAGNOSIS — K219 Gastro-esophageal reflux disease without esophagitis: Secondary | ICD-10-CM

## 2024-01-06 ENCOUNTER — Other Ambulatory Visit: Payer: Self-pay

## 2024-01-06 DIAGNOSIS — K219 Gastro-esophageal reflux disease without esophagitis: Secondary | ICD-10-CM

## 2024-01-06 MED ORDER — ESOMEPRAZOLE MAGNESIUM 20 MG PO CPDR
20.0000 mg | DELAYED_RELEASE_CAPSULE | Freq: Every day | ORAL | 0 refills | Status: DC
Start: 2024-01-06 — End: 2024-04-29

## 2024-01-06 NOTE — Telephone Encounter (Signed)
 Rx already refilled by Alan Showers, PA

## 2024-02-18 ENCOUNTER — Other Ambulatory Visit: Payer: Self-pay

## 2024-02-18 NOTE — Progress Notes (Signed)
 RANDOM UDS & ETOH : PD  LAB SPECIMEN ID #: [id number]  ETOH IS CLEARED

## 2024-03-02 ENCOUNTER — Other Ambulatory Visit: Payer: Self-pay | Admitting: Physician Assistant

## 2024-03-02 DIAGNOSIS — I10 Essential (primary) hypertension: Secondary | ICD-10-CM

## 2024-03-04 DIAGNOSIS — Z79899 Other long term (current) drug therapy: Secondary | ICD-10-CM | POA: Diagnosis not present

## 2024-03-04 DIAGNOSIS — F902 Attention-deficit hyperactivity disorder, combined type: Secondary | ICD-10-CM | POA: Diagnosis not present

## 2024-03-05 DIAGNOSIS — Z79899 Other long term (current) drug therapy: Secondary | ICD-10-CM | POA: Diagnosis not present

## 2024-03-05 DIAGNOSIS — F902 Attention-deficit hyperactivity disorder, combined type: Secondary | ICD-10-CM | POA: Diagnosis not present

## 2024-04-28 ENCOUNTER — Other Ambulatory Visit: Payer: Self-pay | Admitting: Student

## 2024-04-28 DIAGNOSIS — K219 Gastro-esophageal reflux disease without esophagitis: Secondary | ICD-10-CM

## 2024-04-29 ENCOUNTER — Other Ambulatory Visit: Payer: Self-pay

## 2024-04-29 DIAGNOSIS — K219 Gastro-esophageal reflux disease without esophagitis: Secondary | ICD-10-CM

## 2024-04-29 MED ORDER — ESOMEPRAZOLE MAGNESIUM 20 MG PO CPDR: 20.0000 mg | DELAYED_RELEASE_CAPSULE | Freq: Every day | ORAL | 4 refills | Status: AC

## 2024-06-08 DIAGNOSIS — Z79899 Other long term (current) drug therapy: Secondary | ICD-10-CM | POA: Diagnosis not present

## 2024-06-08 DIAGNOSIS — F902 Attention-deficit hyperactivity disorder, combined type: Secondary | ICD-10-CM | POA: Diagnosis not present
